# Patient Record
Sex: Female | Born: 1995 | Race: Black or African American | Hispanic: Yes | Marital: Single | Smoking: Never smoker
Health system: Southern US, Community
[De-identification: ages and names within clinical notes are randomized; demographics above are authoritative.]

## PROBLEM LIST (undated history)

## (undated) DIAGNOSIS — Z789 Other specified health status: Secondary | ICD-10-CM

## (undated) HISTORY — PX: TUBAL LIGATION: SHX77

---
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
## 2016-07-06 ENCOUNTER — Encounter: Payer: Self-pay | Admitting: Physician Assistant

## 2016-07-06 ENCOUNTER — Ambulatory Visit (INDEPENDENT_AMBULATORY_CARE_PROVIDER_SITE_OTHER): Payer: 59 | Admitting: Physician Assistant

## 2016-07-06 VITALS — BP 102/61 | HR 104 | Temp 98.3°F | Resp 16 | Ht 68.0 in | Wt 179.0 lb

## 2016-07-06 DIAGNOSIS — J020 Streptococcal pharyngitis: Secondary | ICD-10-CM

## 2016-07-06 LAB — POCT RAPID STREP A (OFFICE): Rapid Strep A Screen: POSITIVE — AB

## 2016-07-06 MED ORDER — AMOXICILLIN 500 MG PO CAPS: 500.0000 mg | ORAL_CAPSULE | Freq: Two times a day (BID) | ORAL | 0 refills | Status: DC

## 2016-07-06 MED FILL — AMOXICILLIN 500 MG CAPSULE: 500 | 10 days supply | Qty: 20 | Fill #0

## 2016-07-06 NOTE — Patient Instructions (Signed)
Please take antibiotic as directed with food. Stay well hydrated and get plenty of rest. Tylenol or Ibuprofen for fevers and throat pain. Salt water gargles will be beneficial.  Symptoms will gradually resolve with medication. Call or return to clinic if symptoms are not resolving.   Strep Throat Strep throat is an infection of the throat. It is caused by germs. Strep throat spreads from person to person because of coughing, sneezing, or close contact. HOME CARE Medicines  Take over-the-counter and prescription medicines only as told by your doctor.  Take your antibiotic medicine as told by your doctor. Do not stop taking the medicine even if you feel better.  Have family members who also have a sore throat or fever go to a doctor. Eating and Drinking  Do not share food, drinking cups, or personal items.  Try eating soft foods until your sore throat feels better.  Drink enough fluid to keep your pee (urine) clear or pale yellow. General Instructions  Rinse your mouth (gargle) with a salt-water mixture 3-4 times per day or as needed. To make a salt-water mixture, stir -1 tsp of salt into 1 cup of warm water.  Make sure that all people in your house wash their hands well.  Rest.  Stay home from school or work until you have been taking antibiotics for 24 hours.  Keep all follow-up visits as told by your doctor. This is important. GET HELP IF:  Your neck keeps getting bigger.  You get a rash, cough, or earache.  You cough up thick liquid that is green, yellow-brown, or bloody.  You have pain that does not get better with medicine.  Your problems get worse instead of getting better.  You have a fever. GET HELP RIGHT AWAY IF:  You throw up (vomit).  You get a very bad headache.  You neck hurts or it feels stiff.  You have chest pain or you are short of breath.  You have drooling, very bad throat pain, or changes in your voice.  Your neck is swollen or the  skin gets red and tender.  Your mouth is dry or you are peeing less than normal.  You keep feeling more tired or it is hard to wake up.  Your joints are red or they hurt.   This information is not intended to replace advice given to you by your health care provider. Make sure you discuss any questions you have with your health care provider.   Document Released: 03/12/2008 Document Revised: 06/15/2015 Document Reviewed: 01/17/2015 Elsevier Interactive Patient Education [employer]2016 Elsevier Inc.

## 2016-07-06 NOTE — Progress Notes (Signed)
    Patient presents to clinic today c/o 3 days of progressively worsening sore throat with odynophagia. Has noted mild PND without cough of chest congestion. Denies sinus pressure/pain or ear pressure. Is unsure of fever. Denies recent travel or sick contact. Has not taken anything for symptoms.   No past medical history on file.  No current outpatient prescriptions on file prior to visit.   No current facility-administered medications on file prior to visit.     No Known Allergies  Family History  Problem Relation Age of Onset  . Alcohol abuse Maternal Grandmother   . Arthritis Maternal Grandmother   . Hyperlipidemia Maternal Grandmother   . Heart disease Maternal Grandmother   . Hyperlipidemia Maternal Grandfather   . Heart disease Maternal Grandfather   . Diabetes Maternal Grandfather   . Hyperlipidemia Paternal Grandmother   . Heart disease Paternal Grandmother   . Hyperlipidemia Paternal Grandfather   . Heart disease Paternal Grandfather   . Diabetes Paternal Grandfather     Social History   Social History  . Marital status: Single    Spouse name: N/A  . Number of children: N/A  . Years of education: N/A   Social History Main Topics  . Smoking status: Never Smoker  . Smokeless tobacco: None  . Alcohol use No  . Drug use: No  . Sexual activity: No   Other Topics Concern  . None   Social History Narrative  . None   Review of Systems - See HPI.  All other ROS are negative.  BP 102/61 (BP Location: Left Arm, Patient Position: Sitting, Cuff Size: Large)   Pulse (!) 104   Temp 98.3 F (36.8 C) (Oral)   Resp 16   Ht 5\' 8"  (1.727 m)   Wt 179 lb (81.2 kg)   LMP 06/24/2016   SpO2 99%   BMI 27.22 kg/m   Physical Exam  Constitutional: She is oriented to person, place, and time and well-developed, well-nourished, and in no distress.  HENT:  Head: Normocephalic and atraumatic.  Right Ear: Tympanic membrane and external ear normal.  Left Ear: Tympanic  membrane and external ear normal.  Nose: Nose normal.  Mouth/Throat: Uvula is midline and mucous membranes are normal. Oropharyngeal exudate and posterior oropharyngeal erythema present.  Eyes: Conjunctivae are normal.  Neck: Neck supple.  Cardiovascular: Regular rhythm, normal heart sounds and intact distal pulses.   Mildly tachycardic. Pulse rechecked at 102 bpm  Pulmonary/Chest: Effort normal and breath sounds normal. No respiratory distress. She has no wheezes. She has no rales. She exhibits no tenderness.  Lymphadenopathy:    She has cervical adenopathy.  Neurological: She is alert and oriented to person, place, and time.  Skin: Skin is warm and dry. No rash noted.  Vitals reviewed.  Assessment/Plan: 1. Streptococcal sore throat Rapid strep +. Will start ABX - Amoxicillin as directed below. Supportive measures and OTC medications reviewed with patient. FU PRN if symptoms are not resolving.  - amoxicillin (AMOXIL) 500 MG capsule; Take 1 capsule (500 mg total) by mouth 2 (two) times daily.  Dispense: 20 capsule; Refill: 0   Piedad ClimesMartin, Drucilla Cumber [place], [place]-C

## 2016-07-06 NOTE — Progress Notes (Signed)
Pre visit review using our clinic review tool, if applicable. No additional management support is needed unless otherwise documented below in the visit note/SLS  

## 2017-12-23 DIAGNOSIS — L03116 Cellulitis of left lower limb: Secondary | ICD-10-CM | POA: Diagnosis not present

## 2017-12-26 ENCOUNTER — Other Ambulatory Visit: Payer: Self-pay

## 2017-12-26 ENCOUNTER — Encounter: Payer: Self-pay | Admitting: Family Medicine

## 2017-12-26 ENCOUNTER — Ambulatory Visit (INDEPENDENT_AMBULATORY_CARE_PROVIDER_SITE_OTHER): Payer: 59 | Admitting: Family Medicine

## 2017-12-26 VITALS — BP 110/78 | HR 88 | Temp 98.6°F | Resp 14 | Ht 68.0 in | Wt 194.0 lb

## 2017-12-26 DIAGNOSIS — L03116 Cellulitis of left lower limb: Secondary | ICD-10-CM

## 2017-12-26 MED ORDER — DOXYCYCLINE HYCLATE 100 MG PO TABS: 100.0000 mg | ORAL_TABLET | Freq: Two times a day (BID) | ORAL | 0 refills | Status: DC

## 2017-12-26 MED FILL — DOXYCYCLINE HYCLATE 100 MG: 100 | 10 days supply | Qty: 20 | Fill #0

## 2017-12-26 NOTE — Progress Notes (Signed)
   Subjective:    Patient ID: Anita Fernandez, female    DOB: [birth year], 22 y.o.   MRN: [id number]  HPI UC f/u- pt went to UC on 3/18 and dx'd w/ cellulitis of L lower leg.  Was started on Bactrim.  Since then, she reports the leg has become more swollen, more painful.  No fevers.   Review of Systems For ROS see HPI     Objective:   Physical Exam  Constitutional: She appears well-developed and well-nourished. No distress.  Neurological:  Antalgic gait bc of inability to bend L knee  Skin: Skin is warm and dry. There is erythema (small scabbed area in popliteal fossa of L knee w/ marked induration and surrounding redness up to mid thigh and extending around to anterior lower leg).  Vitals reviewed.         Assessment & Plan:  Cellulitis- new to provider, deteriorating per pt despite recent abx.  No fluctuant or drainable area.  Switch to Doxy.  Drew border around current margin of redness and instructed her that if the redness or swelling continues to worsen, she is to go to ER for IV abx.  Pt expressed understanding and is in agreement w/ plan.

## 2017-12-26 NOTE — Patient Instructions (Signed)
Follow up on Monday to recheck infection STOP the Bactrim START the Doxycycline twice daily- w/ food- 1st dose tonight If the redness rapidly races past my outline- please go to the ER! If the pain worsens, please let me know! Hot or cold compresses Call with any questions or concerns Hang in there!!!

## 2017-12-30 ENCOUNTER — Ambulatory Visit (INDEPENDENT_AMBULATORY_CARE_PROVIDER_SITE_OTHER): Payer: 59 | Admitting: Family Medicine

## 2017-12-30 ENCOUNTER — Other Ambulatory Visit: Payer: Self-pay

## 2017-12-30 ENCOUNTER — Encounter: Payer: Self-pay | Admitting: Family Medicine

## 2017-12-30 VITALS — BP 108/80 | HR 86 | Temp 98.7°F | Resp 17 | Ht 68.0 in | Wt 194.4 lb

## 2017-12-30 DIAGNOSIS — L02416 Cutaneous abscess of left lower limb: Secondary | ICD-10-CM | POA: Diagnosis not present

## 2017-12-30 DIAGNOSIS — L03116 Cellulitis of left lower limb: Secondary | ICD-10-CM

## 2017-12-30 NOTE — Patient Instructions (Signed)
Follow up by phone/MyChart in 1 week to let me know how your leg looks Continue the Doxy as directed- take w/ food Apply hot compresses to the leg to help it drain Keep it covered with a bandaid Call with any questions or concerns Hang in there!

## 2017-12-30 NOTE — Progress Notes (Signed)
   Subjective:    Patient ID: Anita Fernandez, female    DOB: [birth year], 22 y.o.   MRN: [id number]  HPI L leg cellulitis- pt started on Doxy on Thursday night.  Reports redness is better, swelling is better.  Scabbed came off and area has been bleeding and oozing regularly.  Pain is improving.   Review of Systems For ROS see HPI     Objective:   Physical Exam  Constitutional: She appears well-developed and well-nourished.  Skin: Skin is warm and dry. There is erythema (much less erythema and induration surrounding central wound in popliteal fossa.  Wound is much larger and oozing as compared to last week but no fluctuance or pus to express).  Vitals reviewed.         Assessment & Plan:  Cellulitis- improving w/ Doxy.  No med changes at this time.  Reviewed wound care.  Reviewed supportive care and red flags that should prompt return.  Pt expressed understanding and is in agreement w/ plan.

## 2018-01-02 ENCOUNTER — Ambulatory Visit: Payer: 59 | Admitting: Family Medicine

## 2018-01-08 ENCOUNTER — Ambulatory Visit: Payer: 59 | Admitting: Family Medicine

## 2018-02-10 DIAGNOSIS — Z3201 Encounter for pregnancy test, result positive: Secondary | ICD-10-CM | POA: Diagnosis not present

## 2018-02-27 DIAGNOSIS — Z3401 Encounter for supervision of normal first pregnancy, first trimester: Secondary | ICD-10-CM | POA: Diagnosis not present

## 2018-03-13 DIAGNOSIS — Z3401 Encounter for supervision of normal first pregnancy, first trimester: Secondary | ICD-10-CM | POA: Diagnosis not present

## 2018-03-13 DIAGNOSIS — R87613 High grade squamous intraepithelial lesion on cytologic smear of cervix (HGSIL): Secondary | ICD-10-CM | POA: Diagnosis not present

## 2018-03-27 DIAGNOSIS — Z3682 Encounter for antenatal screening for nuchal translucency: Secondary | ICD-10-CM | POA: Diagnosis not present

## 2018-03-27 DIAGNOSIS — Z36 Encounter for antenatal screening for chromosomal anomalies: Secondary | ICD-10-CM | POA: Diagnosis not present

## 2018-05-01 DIAGNOSIS — R87611 Atypical squamous cells cannot exclude high grade squamous intraepithelial lesion on cytologic smear of cervix (ASC-H): Secondary | ICD-10-CM | POA: Diagnosis not present

## 2018-05-01 DIAGNOSIS — Z3A17 17 weeks gestation of pregnancy: Secondary | ICD-10-CM | POA: Diagnosis not present

## 2018-05-01 DIAGNOSIS — Z363 Encounter for antenatal screening for malformations: Secondary | ICD-10-CM | POA: Diagnosis not present

## 2018-05-15 DIAGNOSIS — Z363 Encounter for antenatal screening for malformations: Secondary | ICD-10-CM | POA: Diagnosis not present

## 2018-07-10 DIAGNOSIS — Z23 Encounter for immunization: Secondary | ICD-10-CM | POA: Diagnosis not present

## 2018-07-10 DIAGNOSIS — Z3402 Encounter for supervision of normal first pregnancy, second trimester: Secondary | ICD-10-CM | POA: Diagnosis not present

## 2018-07-10 DIAGNOSIS — O99012 Anemia complicating pregnancy, second trimester: Secondary | ICD-10-CM | POA: Diagnosis not present

## 2018-08-07 DIAGNOSIS — R87611 Atypical squamous cells cannot exclude high grade squamous intraepithelial lesion on cytologic smear of cervix (ASC-H): Secondary | ICD-10-CM | POA: Diagnosis not present

## 2018-08-07 DIAGNOSIS — Z3A31 31 weeks gestation of pregnancy: Secondary | ICD-10-CM | POA: Diagnosis not present

## 2018-09-16 ENCOUNTER — Other Ambulatory Visit: Payer: Self-pay | Admitting: General Practice

## 2018-09-16 DIAGNOSIS — Z3A37 37 weeks gestation of pregnancy: Secondary | ICD-10-CM | POA: Diagnosis not present

## 2018-09-16 DIAGNOSIS — R030 Elevated blood-pressure reading, without diagnosis of hypertension: Secondary | ICD-10-CM | POA: Diagnosis not present

## 2018-09-16 DIAGNOSIS — M7989 Other specified soft tissue disorders: Secondary | ICD-10-CM | POA: Diagnosis not present

## 2018-09-16 DIAGNOSIS — O1403 Mild to moderate pre-eclampsia, third trimester: Secondary | ICD-10-CM | POA: Diagnosis not present

## 2018-09-16 DIAGNOSIS — R600 Localized edema: Secondary | ICD-10-CM | POA: Diagnosis not present

## 2018-09-16 DIAGNOSIS — O329XX1 Maternal care for malpresentation of fetus, unspecified, fetus 1: Secondary | ICD-10-CM | POA: Diagnosis not present

## 2018-09-16 DIAGNOSIS — O26893 Other specified pregnancy related conditions, third trimester: Secondary | ICD-10-CM | POA: Diagnosis not present

## 2018-09-16 DIAGNOSIS — Z3483 Encounter for supervision of other normal pregnancy, third trimester: Secondary | ICD-10-CM | POA: Diagnosis not present

## 2018-09-16 DIAGNOSIS — Z3685 Encounter for antenatal screening for Streptococcus B: Secondary | ICD-10-CM | POA: Diagnosis not present

## 2018-09-16 LAB — HM PAP SMEAR

## 2018-09-21 DIAGNOSIS — Z3A37 37 weeks gestation of pregnancy: Secondary | ICD-10-CM | POA: Diagnosis not present

## 2018-09-21 DIAGNOSIS — Z79899 Other long term (current) drug therapy: Secondary | ICD-10-CM | POA: Diagnosis not present

## 2018-09-21 DIAGNOSIS — O99214 Obesity complicating childbirth: Secondary | ICD-10-CM | POA: Diagnosis not present

## 2018-09-21 DIAGNOSIS — E669 Obesity, unspecified: Secondary | ICD-10-CM | POA: Diagnosis not present

## 2018-09-21 DIAGNOSIS — Z8759 Personal history of other complications of pregnancy, childbirth and the puerperium: Secondary | ICD-10-CM | POA: Diagnosis not present

## 2018-09-21 DIAGNOSIS — O471 False labor at or after 37 completed weeks of gestation: Secondary | ICD-10-CM | POA: Diagnosis not present

## 2018-09-21 DIAGNOSIS — O99824 Streptococcus B carrier state complicating childbirth: Secondary | ICD-10-CM | POA: Diagnosis not present

## 2018-10-30 DIAGNOSIS — Z130 Encounter for screening for diseases of the blood and blood-forming organs and certain disorders involving the immune mechanism: Secondary | ICD-10-CM | POA: Diagnosis not present

## 2018-10-30 DIAGNOSIS — R87613 High grade squamous intraepithelial lesion on cytologic smear of cervix (HGSIL): Secondary | ICD-10-CM | POA: Diagnosis not present

## 2018-10-30 DIAGNOSIS — R87610 Atypical squamous cells of undetermined significance on cytologic smear of cervix (ASC-US): Secondary | ICD-10-CM | POA: Diagnosis not present

## 2018-10-30 LAB — HM PAP SMEAR

## 2018-10-31 DIAGNOSIS — R87613 High grade squamous intraepithelial lesion on cytologic smear of cervix (HGSIL): Secondary | ICD-10-CM | POA: Diagnosis not present

## 2018-10-31 DIAGNOSIS — N72 Inflammatory disease of cervix uteri: Secondary | ICD-10-CM | POA: Diagnosis not present

## 2018-11-06 ENCOUNTER — Encounter: Payer: Self-pay | Admitting: General Practice

## 2018-11-19 ENCOUNTER — Encounter: Payer: Self-pay | Admitting: Family Medicine

## 2018-11-19 ENCOUNTER — Other Ambulatory Visit: Payer: Self-pay

## 2018-11-19 ENCOUNTER — Ambulatory Visit (INDEPENDENT_AMBULATORY_CARE_PROVIDER_SITE_OTHER): Payer: 59 | Admitting: Family Medicine

## 2018-11-19 VITALS — BP 118/76 | HR 105 | Temp 99.1°F | Resp 16 | Ht 68.0 in | Wt 198.4 lb

## 2018-11-19 DIAGNOSIS — J029 Acute pharyngitis, unspecified: Secondary | ICD-10-CM

## 2018-11-19 LAB — POCT RAPID STREP A (OFFICE): Rapid Strep A Screen: NEGATIVE

## 2018-11-19 NOTE — Progress Notes (Signed)
   Subjective  CC:  Chief Complaint  Patient presents with  . Sore Throat    Started yesterday, has tried OTC medicine.    HPI: Anita Fernandez is a 23 y.o. female who presents to the office today to address the problems listed above in the chief complaint.  C/o sore throat, mild URI sxs without fever. No SOB or GI sxs. No known exposure ot strep or mono. OTC analgesics have been used with minimal or mild relief. Eating and drinking OK.   I reviewed the patients updated PMH, FH, and SocHx.    Patient Active Problem List   Diagnosis Date Noted  . Insomnia 11/29/2015  . Fatigue 11/29/2015  . Vaginitis and vulvovaginitis 12/15/2014  . Lumbar back pain 06/23/2012  . Sinus headache 06/23/2012   No outpatient medications have been marked as taking for the 11/19/18 encounter (Office Visit) with Willow Ora, MD.    Allergies: Patient has No Known Allergies.  Review of Systems: Constitutional: Negative for fever malaise or anorexia Cardiovascular: negative for chest pain Respiratory: negative for SOB or persistent cough Gastrointestinal: negative for abdominal pain  Objective  Vitals: BP 118/76   Pulse (!) 105   Temp 99.1 F (37.3 C) (Oral)   Resp 16   Ht 5\' 8"  (1.727 m)   Wt 198 lb 6.4 oz (90 kg)   SpO2 98%   BMI 30.17 kg/m  General: no acute distress , A&Ox3 HEENT: PEERL, conjunctiva normal, bilateral EAC and TMs are normal. Nares normal. Oropharynx moist with erythematous posterior pharynx without exudate, + cervical LAD, 2+ tonsils, midline uvula, neck is supple Cardiovascular:  RRR without murmur or gallop.  Respiratory:  Good breath sounds bilaterally, CTAB with normal respiratory effort Skin:  Warm, no rashes  Office Visit on 11/19/2018  Component Date Value Ref Range Status  . Rapid Strep A Screen 11/19/2018 Negative  Negative Final    Assessment  1. Viral pharyngitis   2. Sore throat      Plan   Supportive care with advil, tylenol, gargles etc  discussed. . RTO if sxs persist or worsen.   Follow up: Return if symptoms worsen or fail to improve.    Commons side effects, risks, benefits, and alternatives for medications and treatment plan prescribed today were discussed, and the patient expressed understanding of the given instructions. Patient is instructed to call or message via MyChart if he/she has any questions or concerns regarding our treatment plan. No barriers to understanding were identified. We discussed Red Flag symptoms and signs in detail. Patient expressed understanding regarding what to do in case of urgent or emergency type symptoms.   Medication list was reconciled, printed and provided to the patient in AVS. Patient instructions and summary information was reviewed with the patient as documented in the AVS. This note was prepared with assistance of Dragon voice recognition software. Occasional wrong-word or sound-a-like substitutions may have occurred due to the inherent limitations of voice recognition software  Orders Placed This Encounter  Procedures  . POCT rapid strep A   No orders of the defined types were placed in this encounter.

## 2018-11-19 NOTE — Patient Instructions (Signed)
Please follow up if symptoms do not improve or as needed.    Pharyngitis  Pharyngitis is redness, pain, and swelling (inflammation) of the throat (pharynx). It is a very common cause of sore throat. Pharyngitis can be caused by a bacteria, but it is usually caused by a virus. Most cases of pharyngitis get better on their own without treatment. What are the causes? This condition may be caused by:  Infection by viruses (viral). Viral pharyngitis spreads from person to person (is contagious) through coughing, sneezing, and sharing of personal items or utensils such as cups, forks, spoons, and toothbrushes.  Infection by bacteria (bacterial). Bacterial pharyngitis may be spread by touching the nose or face after coming in contact with the bacteria, or through more intimate contact, such as kissing.  Allergies. Allergies can cause buildup of mucus in the throat (post-nasal drip), leading to inflammation and irritation. Allergies can also cause blocked nasal passages, forcing breathing through the mouth, which dries and irritates the throat. What increases the risk? You are more likely to develop this condition if:  You are 5-24 years old.  You are exposed to crowded environments such as daycare, school, or dormitory living.  You live in a cold climate.  You have a weakened disease-fighting (immune) system. What are the signs or symptoms? Symptoms of this condition vary by the cause (viral, bacterial, or allergies) and can include:  Sore throat.  Fatigue.  Low-grade fever.  Headache.  Joint pain and muscle aches.  Skin rashes.  Swollen glands in the throat (lymph nodes).  Plaque-like film on the throat or tonsils. This is often a symptom of bacterial pharyngitis.  Vomiting.  Stuffy nose (nasal congestion).  Cough.  Red, itchy eyes (conjunctivitis).  Loss of appetite. How is this diagnosed? This condition is often diagnosed based on your medical history and a physical  exam. Your health care provider will ask you questions about your illness and your symptoms. A swab of your throat may be done to check for bacteria (rapid strep test). Other lab tests may also be done, depending on the suspected cause, but these are rare. How is this treated? This condition usually gets better in 3-4 days without medicine. Bacterial pharyngitis may be treated with antibiotic medicines. Follow these instructions at home:  Take over-the-counter and prescription medicines only as told by your health care provider. ? If you were prescribed an antibiotic medicine, take it as told by your health care provider. Do not stop taking the antibiotic even if you start to feel better. ? Do not give children aspirin because of the association with Reye syndrome.  Drink enough water and fluids to keep your urine clear or pale yellow.  Get a lot of rest.  Gargle with a salt-water mixture 3-4 times a day or as needed. To make a salt-water mixture, completely dissolve -1 tsp of salt in 1 cup of warm water.  If your health care provider approves, you may use throat lozenges or sprays to soothe your throat. Contact a health care provider if:  You have large, tender lumps in your neck.  You have a rash.  You cough up green, yellow-brown, or bloody spit. Get help right away if:  Your neck becomes stiff.  You drool or are unable to swallow liquids.  You cannot drink or take medicines without vomiting.  You have severe pain that does not go away, even after you take medicine.  You have trouble breathing, and it is not caused by a   stuffy nose.  You have new pain and swelling in your joints such as the knees, ankles, wrists, or elbows. Summary  Pharyngitis is redness, pain, and swelling (inflammation) of the throat (pharynx).  While pharyngitis can be caused by a bacteria, the most common causes are viral.  Most cases of pharyngitis get better on their own without  treatment.  Bacterial pharyngitis is treated with antibiotic medicines. This information is not intended to replace advice given to you by your health care provider. Make sure you discuss any questions you have with your health care provider. Document Released: 09/24/2005 Document Revised: 10/30/2016 Document Reviewed: 10/30/2016 Elsevier Interactive Patient Education  2019 Elsevier Inc.   

## 2018-11-20 ENCOUNTER — Encounter: Payer: Self-pay | Admitting: Family Medicine

## 2019-02-05 ENCOUNTER — Ambulatory Visit (INDEPENDENT_AMBULATORY_CARE_PROVIDER_SITE_OTHER): Payer: 59 | Admitting: Family Medicine

## 2019-02-05 ENCOUNTER — Encounter: Payer: Self-pay | Admitting: Family Medicine

## 2019-02-05 ENCOUNTER — Other Ambulatory Visit: Payer: Self-pay

## 2019-02-05 VITALS — Ht 68.0 in | Wt 194.0 lb

## 2019-02-05 DIAGNOSIS — R5383 Other fatigue: Secondary | ICD-10-CM

## 2019-02-05 DIAGNOSIS — L659 Nonscarring hair loss, unspecified: Secondary | ICD-10-CM | POA: Diagnosis not present

## 2019-02-05 NOTE — Progress Notes (Signed)
   Virtual Visit via Video   I connected with patient on 02/05/19 at  1:00 PM EDT by a video enabled telemedicine application and verified that I am speaking with the correct person using two identifiers.  Location patient: Home Location provider: [facility], Office Persons participating in the virtual visit: Patient, Provider, CMA (Jess B)  I discussed the limitations of evaluation and management by telemedicine and the availability of in person appointments. The patient expressed understanding and agreed to proceed.  Subjective:   HPI:  Hair loss- pt reports recent hair loss (~2 weeks).  She is 4 months post-partum.  She talked to [facility] and was told to contact primary.  + fatigue.  Heavier menstrual cycle.  Pt does have hx of low iron.     ROS:   See pertinent positives and negatives per HPI.  Patient Active Problem List   Diagnosis Date Noted  . Insomnia 11/29/2015  . Fatigue 11/29/2015  . Vaginitis and vulvovaginitis 12/15/2014  . Lumbar back pain 06/23/2012  . Sinus headache 06/23/2012    Social History   Tobacco Use  . Smoking status: Never Smoker  . Smokeless tobacco: Never Used  Substance Use Topics  . Alcohol use: No   No current outpatient medications on file.  No Known Allergies  Objective:   Ht 5\' 8"  (1.727 m)   Wt 194 lb (88 kg)   BMI 29.50 kg/m   AAOx3, NAD NCAT, EOMI No obvious CN deficits Coloring WNL Pt is able to speak clearly, coherently without shortness of breath or increased work of breathing.  Thought process is linear.  Mood is appropriate.   Assessment and Plan:   Hair loss/fatigue- discussed that this could be thyroid related or iron deficiency but that most likely it is just post-partum.  We will check labs to ensure that thyroid and iron levels are normal.  Pt expressed understanding and is in agreement w/ plan.     Neena Rhymes, MD 02/05/2019

## 2019-02-05 NOTE — Progress Notes (Signed)
I have discussed the procedure for the virtual visit with the patient who has given consent to proceed with assessment and treatment.   Tamecca Artiga L Renan Danese, CMA     

## 2019-02-06 ENCOUNTER — Other Ambulatory Visit (INDEPENDENT_AMBULATORY_CARE_PROVIDER_SITE_OTHER): Payer: 59

## 2019-02-06 DIAGNOSIS — R5383 Other fatigue: Secondary | ICD-10-CM | POA: Diagnosis not present

## 2019-02-06 DIAGNOSIS — L659 Nonscarring hair loss, unspecified: Secondary | ICD-10-CM | POA: Diagnosis not present

## 2019-02-06 LAB — CBC WITH DIFFERENTIAL/PLATELET
Basophils Absolute: 0 10*3/uL (ref 0.0–0.1)
Basophils Relative: 0.2 % (ref 0.0–3.0)
Eosinophils Absolute: 0.1 10*3/uL (ref 0.0–0.7)
Eosinophils Relative: 1.1 % (ref 0.0–5.0)
HCT: 34.8 % — ABNORMAL LOW (ref 36.0–46.0)
Hemoglobin: 11.8 g/dL — ABNORMAL LOW (ref 12.0–15.0)
Lymphocytes Relative: 29 % (ref 12.0–46.0)
Lymphs Abs: 1.6 10*3/uL (ref 0.7–4.0)
MCHC: 33.9 g/dL (ref 30.0–36.0)
MCV: 77.1 fl — ABNORMAL LOW (ref 78.0–100.0)
Monocytes Absolute: 0.4 10*3/uL (ref 0.1–1.0)
Monocytes Relative: 7.5 % (ref 3.0–12.0)
Neutro Abs: 3.4 10*3/uL (ref 1.4–7.7)
Neutrophils Relative %: 62.2 % (ref 43.0–77.0)
Platelets: 258 10*3/uL (ref 150.0–400.0)
RBC: 4.51 Mil/uL (ref 3.87–5.11)
RDW: 15.4 % (ref 11.5–15.5)
WBC: 5.5 10*3/uL (ref 4.0–10.5)

## 2019-02-06 LAB — TSH: TSH: 1 u[IU]/mL (ref 0.35–4.50)

## 2019-02-07 LAB — IRON,TIBC AND FERRITIN PANEL
%SAT: 8 % (calc) — ABNORMAL LOW (ref 16–45)
Ferritin: 6 ng/mL — ABNORMAL LOW (ref 16–154)
Iron: 22 ug/dL — ABNORMAL LOW (ref 40–190)
TIBC: 269 mcg/dL (calc) (ref 250–450)

## 2019-02-09 ENCOUNTER — Encounter: Payer: Self-pay | Admitting: Family Medicine

## 2019-02-09 ENCOUNTER — Other Ambulatory Visit: Payer: Self-pay | Admitting: General Practice

## 2019-02-09 MED ORDER — FERROUS SULFATE 325 (65 FE) MG PO TABS: 325.0000 mg | ORAL_TABLET | Freq: Every day | ORAL | 1 refills | Status: DC

## 2019-10-12 ENCOUNTER — Telehealth: Payer: Self-pay | Admitting: Family Medicine

## 2019-10-12 ENCOUNTER — Encounter: Payer: Self-pay | Admitting: General Practice

## 2019-10-12 NOTE — Telephone Encounter (Signed)
My chart message also sent.

## 2019-10-12 NOTE — Telephone Encounter (Signed)
Please advise on refill and I have LM for pt to call back to schedule a VV with Tabori.     Copied from CRM [id number]. Topic: General - Other >> Oct 08, 2019  1:57 PM Jaquita Rector A wrote: Reason for CRM: Patient called to request an Rx sent to her pharmacy from Dr Beverely Low. Patient has SOB she is willing to do a virtual visit and states that if there are any questions please call her at Ph# [phone] >> Oct 08, 2019  2:04 PM Jaquita Rector A wrote: [id number] - [place], [place] - [address] Phone:  [phone]  Fax:  [phone]

## 2019-10-13 NOTE — Telephone Encounter (Signed)
Tried calling pt again this morning. Encounter closed until pt returns call.

## 2019-11-12 DIAGNOSIS — R87610 Atypical squamous cells of undetermined significance on cytologic smear of cervix (ASC-US): Secondary | ICD-10-CM | POA: Diagnosis not present

## 2019-11-27 MED FILL — NORLYDA 0.35 MG TABS: 0.35 | 28 days supply | Qty: 28 | Fill #0

## 2019-12-08 MED FILL — NORLYDA 0.35 MG TABS: 0.35 | 28 days supply | Qty: 28 | Fill #0

## 2019-12-30 MED FILL — NORETHINDRONE 0.35 MG TABS: 0.35 | 84 days supply | Qty: 84 | Fill #0

## 2020-02-09 DIAGNOSIS — N72 Inflammatory disease of cervix uteri: Secondary | ICD-10-CM | POA: Diagnosis not present

## 2020-02-09 DIAGNOSIS — R87810 Cervical high risk human papillomavirus (HPV) DNA test positive: Secondary | ICD-10-CM | POA: Diagnosis not present

## 2020-02-09 DIAGNOSIS — B977 Papillomavirus as the cause of diseases classified elsewhere: Secondary | ICD-10-CM | POA: Diagnosis not present

## 2020-03-28 MED FILL — NORETHINDRONE 0.35 MG TABS: 0.35 | 84 days supply | Qty: 84 | Fill #1

## 2020-05-07 ENCOUNTER — Ambulatory Visit: Payer: 59

## 2020-06-06 ENCOUNTER — Other Ambulatory Visit: Payer: Self-pay

## 2020-06-06 ENCOUNTER — Encounter: Payer: Self-pay | Admitting: Physician Assistant

## 2020-06-06 ENCOUNTER — Telehealth (INDEPENDENT_AMBULATORY_CARE_PROVIDER_SITE_OTHER): Payer: 59 | Admitting: Physician Assistant

## 2020-06-06 DIAGNOSIS — Z20822 Contact with and (suspected) exposure to covid-19: Secondary | ICD-10-CM | POA: Diagnosis not present

## 2020-06-06 NOTE — Progress Notes (Signed)
° °  Virtual Visit via Video   I connected with patient on 06/06/20 at  3:00 PM EDT by a video enabled telemedicine application and verified that I am speaking with the correct person using two identifiers.  Location patient: Home Location provider: [place] [facility], Office Persons participating in the virtual visit: Patient, Provider, CMA (Patina Moore)  I discussed the limitations of evaluation and management by telemedicine and the availability of in person appointments. The patient expressed understanding and agreed to proceed.  Subjective:   HPI:   Patient presents via [facility] today c/o 1 day of chills, fatigue, headaches, body aches. Has felt feverish but has not been able to check temperature. Denies recent travel or sick contact.  Does note significant chest congestion and cough that is productive of yellow phlegm.  Denies loss of taste or smell.  Denies GI upset.  Has noted urine is slightly darker yellow than normal but denies any urinary urgency, frequency, hesitancy, abdominal pain, nausea or vomiting.  Denies flank pain.  Has not taken anything for her symptoms.  ROS:   See pertinent positives and negatives per HPI.  Patient Active Problem List   Diagnosis Date Noted   Insomnia 11/29/2015   Fatigue 11/29/2015   Vaginitis and vulvovaginitis 12/15/2014   Lumbar back pain 06/23/2012   Sinus headache 06/23/2012    Social History   Tobacco Use   Smoking status: Never Smoker   Smokeless tobacco: Never Used  Substance Use Topics   Alcohol use: No    Current Outpatient Medications:    norethindrone (MICRONOR) 0.35 MG tablet, Take 1 tablet by mouth daily., Disp: , Rfl:   No Known Allergies  Objective:   There were no vitals taken for this visit.  Patient is well-developed, well-nourished in no acute distress.  Resting comfortably at home.  Head is normocephalic, atraumatic.  No labored breathing.  Speech is clear and coherent with logical content.    Patient is alert and oriented at baseline.   Assessment and Plan:   1. Suspected COVID-19 virus infection Patient has been sent for testing.  She is to quarantine until test results are in.  Patient is also been enrolled in home Covid symptom monitoring program.  She is to complete daily questionnaires as directed.  Want her to continue hydrating well and getting plenty of rest.  Start saline nasal rinse.  OTC Mucinex DM recommended.  Patient also encouraged to start a regimen of vitamin D3 1000 units daily, vitamin C 1000 mg daily and an over-the-counter zinc supplement.  Other OTC medications reviewed with patient.  Giving change in coloration of her phlegm and slight cough noted last week (her son had RSV), will add on Azithromycin to cover for any bacterial bronchitis present.  Strict ER precautions reviewed with patient. - MyChart COVID-19 home monitoring program; Future - Temperature monitoring; Future    Piedad Climes, PA-C 06/06/2020

## 2020-06-06 NOTE — Patient Instructions (Signed)
Instructions sent to MyChart

## 2020-06-06 NOTE — Progress Notes (Signed)
I have discussed the procedure for the virtual visit with the patient who has given consent to proceed with assessment and treatment.   Ladaija Dimino S Lyon Dumont, CMA     

## 2020-06-07 MED ORDER — AZITHROMYCIN 250 MG PO TABS: ORAL_TABLET | ORAL | 0 refills | Status: DC

## 2020-06-09 ENCOUNTER — Other Ambulatory Visit: Payer: Self-pay

## 2020-06-09 ENCOUNTER — Emergency Department
Admission: EM | Admit: 2020-06-09 | Discharge: 2020-06-09 | Disposition: A | Discharge status: Home / Self Care | Payer: 59 | Source: Home / Self Care

## 2020-06-09 ENCOUNTER — Encounter: Payer: Self-pay | Admitting: Physician Assistant

## 2020-06-09 DIAGNOSIS — B085 Enteroviral vesicular pharyngitis: Secondary | ICD-10-CM

## 2020-06-09 DIAGNOSIS — K120 Recurrent oral aphthae: Secondary | ICD-10-CM

## 2020-06-09 DIAGNOSIS — J029 Acute pharyngitis, unspecified: Secondary | ICD-10-CM

## 2020-06-09 MED ORDER — MAGIC MOUTHWASH W/LIDOCAINE: 10.0000 mL | Freq: Three times a day (TID) | ORAL | 0 refills | Status: DC | PRN

## 2020-06-09 MED ORDER — ACETAMINOPHEN 325 MG PO TABS
650.0000 mg | ORAL_TABLET | Freq: Four times a day (QID) | ORAL | Status: DC | PRN
  Administered 2020-06-09: 650 mg via ORAL

## 2020-06-09 MED ORDER — PREDNISONE 50 MG PO TABS: 50.0000 mg | ORAL_TABLET | Freq: Every day | ORAL | 0 refills | Status: AC

## 2020-06-09 NOTE — Discharge Instructions (Addendum)
  You may take 500mg  acetaminophen every 4-6 hours or in combination with ibuprofen 400-600mg  every 6-8 hours as needed for pain, inflammation, and fever.  Be sure to well hydrated with clear liquids and get at least 8 hours of sleep at night, preferably more while sick.   Please follow up with family medicine in 4-5 days if not improving, sooner if worsening.

## 2020-06-09 NOTE — ED Triage Notes (Signed)
Patient presents to Urgent Care with complaints of tongue pain since 4 days ago. Patient reports she did a virtual visit and was told to be covid tested (she did and it was negative). Pt states she still has irritated taste buds on the lateral parts of her tongue and feels like she is talking with a lisp.

## 2020-06-09 NOTE — ED Provider Notes (Signed)
[facility]    CSN: [id number] Arrival date & time: 06/09/20  1235      History   Chief Complaint Chief Complaint  Patient presents with  . Oral Pain    HPI Anita Fernandez is a 24 y.o. female.   HPI Anita Fernandez is a 24 y.o. female presenting to UC with c/o sore throat, tongue pain, fever, chills, and body aches for about 4 days. She completed an Evisit and was instructed to be tested for COVID. She went toe a [facility] testing site for work, tested Negative.  She has been vaccinated for COVID with the J&J vaccine beginning of August. She had 2 days of body aches that fully resolved.  She feels like her taste buds are irritated on the sides of her tongue, making her feel like she is talking with a lisp. Denies n/v/d. No chest pain or SOB. No known sick contacts. She has been able to keep down fluids. She has been taking Tylenol but none PTA. She is also on day 4 of Azithromycin, prescribed during the E-visit.    History reviewed. No pertinent past medical history.  Patient Active Problem List   Diagnosis Date Noted  . Insomnia 11/29/2015  . Fatigue 11/29/2015  . Vaginitis and vulvovaginitis 12/15/2014  . Lumbar back pain 06/23/2012  . Sinus headache 06/23/2012    History reviewed. No pertinent surgical history.  OB History   No obstetric history on file.      Home Medications    Prior to Admission medications   Medication Sig Start Date End Date Taking? Authorizing Provider  azithromycin (ZITHROMAX) 250 MG tablet Take 2 tablets on Day 1. Then take 1 tablet daily. 06/07/20  Yes Waldon Merl, PA-C  magic mouthwash w/lidocaine SOLN Take 10 mLs by mouth 3 (three) times daily as needed for mouth pain. Swish, gargle for 30-60 seconds then spit 06/09/20   Lurene Shadow, PA-C  norethindrone (MICRONOR) 0.35 MG tablet Take 1 tablet by mouth daily. 03/28/20   [provider]  predniSONE (DELTASONE) 50 MG tablet Take 1 tablet (50 mg total) by mouth daily  with breakfast for 5 days. 06/09/20 06/14/20  Lurene Shadow, PA-C    Family History Family History  Problem Relation Age of Onset  . Alcohol abuse Maternal Grandmother   . Arthritis Maternal Grandmother   . Hyperlipidemia Maternal Grandmother   . Heart disease Maternal Grandmother   . Hyperlipidemia Maternal Grandfather   . Heart disease Maternal Grandfather   . Diabetes Maternal Grandfather   . Hyperlipidemia Paternal Grandmother   . Heart disease Paternal Grandmother   . Hyperlipidemia Paternal Grandfather   . Heart disease Paternal Grandfather   . Diabetes Paternal Grandfather   . Healthy Mother   . Healthy Father     Social History Social History   Tobacco Use  . Smoking status: Never Smoker  . Smokeless tobacco: Never Used  Vaping Use  . Vaping Use: Never used  Substance Use Topics  . Alcohol use: Yes    Comment: socially  . Drug use: No     Allergies   Patient has no known allergies.   Review of Systems Review of Systems  Constitutional: Positive for chills and fever.  HENT: Positive for mouth sores and sore throat. Negative for congestion, ear pain, trouble swallowing and voice change.   Respiratory: Negative for cough and shortness of breath.   Cardiovascular: Negative for chest pain and palpitations.  Gastrointestinal: Negative for abdominal  pain, diarrhea, nausea and vomiting.  Musculoskeletal: Negative for arthralgias, back pain and myalgias.  Skin: Negative for rash.  All other systems reviewed and are negative.    Physical Exam Triage Vital Signs ED Triage Vitals  Enc Vitals Group     BP 06/09/20 1304 114/76     Pulse Rate 06/09/20 1304 (!) 103     Resp 06/09/20 1304 16     Temp 06/09/20 1304 (!) 101.3 F (38.5 C)     Temp Source 06/09/20 1304 Oral     SpO2 06/09/20 1304 97 %     Weight --      Height --      Head Circumference --      Peak Flow --      Pain Score 06/09/20 1303 3     Pain Loc --      Pain Edu? --      Excl. in GC? --      No data found.  Updated Vital Signs BP 114/76 (BP Location: Right Arm)   Pulse (!) 103   Temp (!) 101.3 F (38.5 C) (Oral)   Resp 16   SpO2 97%   Visual Acuity Right Eye Distance:   Left Eye Distance:   Bilateral Distance:    Right Eye Near:   Left Eye Near:    Bilateral Near:     Physical Exam Vitals and nursing note reviewed.  Constitutional:      General: She is not in acute distress.    Appearance: Normal appearance. She is well-developed. She is not ill-appearing, toxic-appearing or diaphoretic.  HENT:     Head: Normocephalic and atraumatic.     Right Ear: Tympanic membrane and ear canal normal.     Left Ear: Tympanic membrane and ear canal normal.     Nose: Nose normal.     Mouth/Throat:     Lips: Pink.     Mouth: Mucous membranes are moist.     Tongue: Lesions present.     Pharynx: Oropharynx is clear. Uvula midline. Posterior oropharyngeal erythema present. No pharyngeal swelling, oropharyngeal exudate or uvula swelling.     Tonsils: No tonsillar exudate or tonsillar abscesses.   Cardiovascular:     Rate and Rhythm: Normal rate and regular rhythm.  Pulmonary:     Effort: Pulmonary effort is normal. No respiratory distress.     Breath sounds: Normal breath sounds. No stridor. No wheezing, rhonchi or rales.  Musculoskeletal:        General: Normal range of motion.     Cervical back: Normal range of motion.  Skin:    General: Skin is warm and dry.  Neurological:     Mental Status: She is alert and oriented to person, place, and time.  Psychiatric:        Behavior: Behavior normal.      UC Treatments / Results  Labs (all labs ordered are listed, but only abnormal results are displayed) Labs Reviewed - No data to display  EKG   Radiology No results found.  Procedures Procedures (including critical care time)  Medications Ordered in UC Medications  acetaminophen (TYLENOL) tablet 650 mg (650 mg Oral Given 06/09/20 1309)    Initial Impression  / Assessment and Plan / UC Course  I have reviewed the triage vital signs and the nursing notes.  Pertinent labs & imaging results that were available during my care of the patient were reviewed by me and considered in my medical decision making (see chart  for details).     Hx and exam c/w viral pharyngitis Pt is on day 4 of azithromycin, may complete the 5 day course Will tx symptomatically Encouraged to stay out of work until fever free for 24 hours even though COVID was negative  F/u with PCP as needed AVS given  Final Clinical Impressions(s) / UC Diagnoses   Final diagnoses:  Acute herpangina  Acute pharyngitis, unspecified etiology  Canker sore     Discharge Instructions      You may take 500mg  acetaminophen every 4-6 hours or in combination with ibuprofen 400-600mg  every 6-8 hours as needed for pain, inflammation, and fever.  Be sure to well hydrated with clear liquids and get at least 8 hours of sleep at night, preferably more while sick.   Please follow up with family medicine in 4-5 days if not improving, sooner if worsening.     ED Prescriptions    Medication Sig Dispense Auth. Provider   magic mouthwash w/lidocaine SOLN Take 10 mLs by mouth 3 (three) times daily as needed for mouth pain. Swish, gargle for 30-60 seconds then spit 100 mL Izetta Sakamoto O, PA-C   predniSONE (DELTASONE) 50 MG tablet Take 1 tablet (50 mg total) by mouth daily with breakfast for 5 days. 5 tablet 05-20-1987, PA-C     PDMP not reviewed this encounter.   Lurene Shadow, Lurene Shadow 06/09/20 1443

## 2020-06-14 ENCOUNTER — Encounter: Payer: Self-pay | Admitting: Family Medicine

## 2020-06-15 MED FILL — NORETHINDRONE 0.35 MG TABS: 0.35 | 84 days supply | Qty: 84 | Fill #2

## 2020-09-06 ENCOUNTER — Other Ambulatory Visit (HOSPITAL_COMMUNITY): Payer: Self-pay

## 2020-09-06 MED FILL — NORETHINDRONE 0.35 MG TABS: 0.35 | 84 days supply | Qty: 84 | Fill #0

## 2020-11-29 ENCOUNTER — Other Ambulatory Visit (HOSPITAL_COMMUNITY): Payer: Self-pay

## 2020-11-30 DIAGNOSIS — Z01419 Encounter for gynecological examination (general) (routine) without abnormal findings: Secondary | ICD-10-CM | POA: Diagnosis not present

## 2020-11-30 DIAGNOSIS — Z118 Encounter for screening for other infectious and parasitic diseases: Secondary | ICD-10-CM | POA: Diagnosis not present

## 2020-11-30 DIAGNOSIS — Z6835 Body mass index (BMI) 35.0-35.9, adult: Secondary | ICD-10-CM | POA: Diagnosis not present

## 2020-11-30 DIAGNOSIS — R87810 Cervical high risk human papillomavirus (HPV) DNA test positive: Secondary | ICD-10-CM | POA: Diagnosis not present

## 2020-11-30 DIAGNOSIS — Z113 Encounter for screening for infections with a predominantly sexual mode of transmission: Secondary | ICD-10-CM | POA: Diagnosis not present

## 2020-11-30 LAB — HM PAP SMEAR

## 2020-11-30 MED FILL — NORETHINDRONE 0.35 MG TABS: 0.35 | 84 days supply | Qty: 84 | Fill #0

## 2020-11-30 MED FILL — SERTRALINE HCL 25 MG TABLET: 25 | 30 days supply | Qty: 30 | Fill #0

## 2020-12-14 ENCOUNTER — Ambulatory Visit: Payer: 59 | Admitting: Family Medicine

## 2020-12-28 DIAGNOSIS — F3289 Other specified depressive episodes: Secondary | ICD-10-CM | POA: Diagnosis not present

## 2020-12-29 ENCOUNTER — Other Ambulatory Visit (HOSPITAL_BASED_OUTPATIENT_CLINIC_OR_DEPARTMENT_OTHER): Payer: Self-pay

## 2021-01-18 ENCOUNTER — Other Ambulatory Visit (HOSPITAL_COMMUNITY): Payer: Self-pay

## 2021-01-18 MED ORDER — SERTRALINE HCL 50 MG PO TABS
ORAL_TABLET | ORAL | 3 refills | Status: DC
  Filled 2021-01-18: qty 30, 30d supply, fill #0
  Filled 2021-03-10: qty 30, 30d supply, fill #1

## 2021-01-26 ENCOUNTER — Other Ambulatory Visit (HOSPITAL_COMMUNITY): Payer: Self-pay

## 2021-03-10 ENCOUNTER — Other Ambulatory Visit (HOSPITAL_COMMUNITY): Payer: Self-pay

## 2021-03-10 MED FILL — Norethindrone Tab 0.35 MG: ORAL | 28 days supply | Qty: 28 | Fill #0 | Status: AC

## 2021-04-05 ENCOUNTER — Encounter: Payer: Self-pay | Admitting: *Deleted

## 2021-04-17 ENCOUNTER — Other Ambulatory Visit (HOSPITAL_COMMUNITY): Payer: Self-pay

## 2021-04-17 MED FILL — Norethindrone Tab 0.35 MG: ORAL | 84 days supply | Qty: 84 | Fill #1 | Status: AC

## 2021-06-09 ENCOUNTER — Telehealth: Payer: 59 | Admitting: Family Medicine

## 2021-06-09 ENCOUNTER — Telehealth: Payer: Self-pay | Admitting: Family Medicine

## 2021-06-13 NOTE — Progress Notes (Signed)
Err

## 2021-07-17 ENCOUNTER — Ambulatory Visit: Payer: 59 | Admitting: Registered Nurse

## 2021-09-04 DIAGNOSIS — O3680X9 Pregnancy with inconclusive fetal viability, other fetus: Secondary | ICD-10-CM | POA: Diagnosis not present

## 2021-09-06 ENCOUNTER — Other Ambulatory Visit: Payer: Self-pay

## 2021-09-06 ENCOUNTER — Other Ambulatory Visit (HOSPITAL_COMMUNITY)
Admission: RE | Admit: 2021-09-06 | Discharge: 2021-09-06 | Disposition: A | Discharge status: Home / Self Care | Payer: 59 | Source: Ambulatory Visit | Attending: Registered Nurse | Admitting: Registered Nurse

## 2021-09-06 ENCOUNTER — Encounter: Payer: Self-pay | Admitting: Registered Nurse

## 2021-09-06 ENCOUNTER — Ambulatory Visit: Payer: 59 | Admitting: Registered Nurse

## 2021-09-06 VITALS — BP 131/71 | HR 68 | Temp 98.2°F | Resp 18 | Ht 68.0 in | Wt 230.0 lb

## 2021-09-06 DIAGNOSIS — F419 Anxiety disorder, unspecified: Secondary | ICD-10-CM | POA: Diagnosis not present

## 2021-09-06 DIAGNOSIS — R102 Pelvic and perineal pain: Secondary | ICD-10-CM | POA: Insufficient documentation

## 2021-09-06 LAB — POCT URINALYSIS DIPSTICK
Bilirubin, UA: NEGATIVE
Blood, UA: NEGATIVE
Glucose, UA: NEGATIVE
Ketones, UA: NEGATIVE
Nitrite, UA: NEGATIVE
Protein, UA: NEGATIVE
Spec Grav, UA: 1.01 (ref 1.010–1.025)
Urobilinogen, UA: 1 E.U./dL
pH, UA: 6.5 (ref 5.0–8.0)

## 2021-09-06 LAB — POCT URINE PREGNANCY: Preg Test, Ur: NEGATIVE

## 2021-09-06 NOTE — Patient Instructions (Addendum)
Anita Fernandez -   Negative pregnancy test on site today.  Urinalysis shows some white blood cells, but nothing conclusive for UTI. I'll send a culture  We will also check for STI, yeast, and BV. These results will be back in the coming days.  I have ordered a pelvic ultrasound to [facility] at [facility]. You'll get a call to schedule this.  Depending on results, we can decide on further intervention from there  Thank you!  Rich     If you have lab work done today you will be contacted with your lab results within the next 2 weeks.  If you have not heard from [place] then please contact us. The fastest way to get your results is to register for My Chart.   IF you received an x-ray today, you will receive an invoice from [facility]. Please contact [facility] at [phone] with questions or concerns regarding your invoice.   IF you received labwork today, you will receive an invoice from [place]. Please contact [facility] at [phone] with questions or concerns regarding your invoice.   Our billing staff will not be able to assist you with questions regarding bills from these companies.  You will be contacted with the lab results as soon as they are available. The fastest way to get your results is to activate your My Chart account. Instructions are located on the last page of this paperwork. If you have not heard from [place] regarding the results in 2 weeks, please contact this office.

## 2021-09-06 NOTE — Progress Notes (Signed)
Established Patient Office Visit  Subjective:  Patient ID: Anita Fernandez, female    DOB: 10-01-1996  Age: 25 y.o. MRN: [id number]  CC:  Chief Complaint  Patient presents with   Fatigue    Patient states over the last week she has been very fatigue, and feeling like little fluttering feeling in her stomach. Patient has took multiple pregnancy test and they were all negative and even had blood work at [facility].    HPI Anita Fernandez presents for pregnancy test -   Has felt disturbance in abdomen Home preg test negative x 2 Had beta quant drawn through [facility] on 09/04/21 - negative with result <1  LMP - 08/28/21 - 08/31/21. This was lighter than her normal periods, normal length.  Having some back pain/discomfort Frequency in urination Fatigue Feels rolling/flutters in abdomen, states two other people have felt this  Currently sexually active - not seeking pregnancy but "if it happens, it happens".    Ongoing anxiety Requesting referral to counselor Notes upcoming job change as source of stress Denies hi/si Has been on sertraline in past but personal preference to avoid medication  History reviewed. No pertinent past medical history.  History reviewed. No pertinent surgical history.  Family History  Problem Relation Age of Onset   Alcohol abuse Maternal Grandmother    Arthritis Maternal Grandmother    Hyperlipidemia Maternal Grandmother    Heart disease Maternal Grandmother    Hyperlipidemia Maternal Grandfather    Heart disease Maternal Grandfather    Diabetes Maternal Grandfather    Hyperlipidemia Paternal Grandmother    Heart disease Paternal Grandmother    Hyperlipidemia Paternal Grandfather    Heart disease Paternal Grandfather    Diabetes Paternal Grandfather    Healthy Mother    Healthy Father     Social History   Socioeconomic History   Marital status: Single    Spouse name: Not on file   Number of children: Not on file   Years of education:  Not on file   Highest education level: Not on file  Occupational History   Not on file  Tobacco Use   Smoking status: Never   Smokeless tobacco: Never  Vaping Use   Vaping Use: Never used  Substance and Sexual Activity   Alcohol use: Yes    Comment: socially   Drug use: No   Sexual activity: Never  Other Topics Concern   Not on file  Social History Narrative   Not on file   Social Determinants of Health   Financial Resource Strain: Not on file  Food Insecurity: Not on file  Transportation Needs: Not on file  Physical Activity: Not on file  Stress: Not on file  Social Connections: Not on file  Intimate Partner Violence: Not on file    Outpatient Medications Prior to Visit  Medication Sig Dispense Refill   azithromycin (ZITHROMAX) 250 MG tablet Take 2 tablets on Day 1. Then take 1 tablet daily. 6 tablet 0   magic mouthwash w/lidocaine SOLN Take 10 mLs by mouth 3 (three) times daily as needed for mouth pain. Swish, gargle for 30-60 seconds then spit 100 mL 0   norethindrone (MICRONOR) 0.35 MG tablet Take 1 tablet by mouth daily.     norethindrone (MICRONOR) 0.35 MG tablet TAKE 1 TABLET BY MOUTH ONCE DAILY 28 tablet 14   norethindrone (MICRONOR) 0.35 MG tablet TAKE 1 TABLET BY MOUTH ONCE DAILY 84 tablet 1   sertraline (ZOLOFT) 25 MG tablet TAKE 1 TABLET  BY MOUTH ONCE DAILY 30 tablet 1   sertraline (ZOLOFT) 50 MG tablet Take 1 tablet every day by oral route for 30 days. 30 tablet 3   No facility-administered medications prior to visit.    No Known Allergies  ROS Review of Systems  Constitutional: Negative.   HENT: Negative.    Eyes: Negative.   Respiratory: Negative.    Cardiovascular: Negative.   Gastrointestinal: Negative.   Genitourinary:  Positive for frequency, pelvic pain and urgency.  Musculoskeletal:  Positive for back pain.  Skin: Negative.   Neurological: Negative.   Psychiatric/Behavioral: Negative.    All other systems reviewed and are negative.     Objective:    Physical Exam Vitals and nursing note reviewed.  Constitutional:      General: She is not in acute distress.    Appearance: Normal appearance. She is normal weight. She is not ill-appearing, toxic-appearing or diaphoretic.  Cardiovascular:     Rate and Rhythm: Normal rate and regular rhythm.     Heart sounds: Normal heart sounds. No murmur heard.   No friction rub. No gallop.  Pulmonary:     Effort: Pulmonary effort is normal. No respiratory distress.     Breath sounds: Normal breath sounds. No stridor. No wheezing, rhonchi or rales.  Chest:     Chest wall: No tenderness.  Abdominal:     General: Abdomen is flat. Bowel sounds are normal. There is no distension.     Palpations: There is no mass.     Tenderness: There is no abdominal tenderness. There is no right CVA tenderness, left CVA tenderness, guarding or rebound.     Hernia: No hernia is present.  Skin:    General: Skin is warm and dry.  Neurological:     General: No focal deficit present.     Mental Status: She is alert and oriented to person, place, and time. Mental status is at baseline.  Psychiatric:        Mood and Affect: Mood normal.        Behavior: Behavior normal.        Thought Content: Thought content normal.        Judgment: Judgment normal.    BP 131/71   Pulse 68   Temp 98.2 F (36.8 C) (Temporal)   Resp 18   Ht 5\' 8"  (1.727 m)   Wt 230 lb (104.3 kg)   SpO2 99%   BMI 34.97 kg/m  Wt Readings from Last 3 Encounters:  09/06/21 230 lb (104.3 kg)  02/05/19 194 lb (88 kg)  11/19/18 198 lb 6.4 oz (90 kg)     Health Maintenance Due  Topic Date Due   COVID-19 Vaccine (1) Never done   Pneumococcal Vaccine 49-37 Years old (1 - PCV) Never done   HIV Screening  Never done   Hepatitis C Screening  Never done    There are no preventive care reminders to display for this patient.  Lab Results  Component Value Date   TSH 1.00 02/06/2019   Lab Results  Component Value Date   WBC 5.5  02/06/2019   HGB 11.8 (L) 02/06/2019   HCT 34.8 (L) 02/06/2019   MCV 77.1 (L) 02/06/2019   PLT 258.0 02/06/2019   Lab Results  Component Value Date   NA 142 11/29/2015   K 4.1 11/29/2015   BUN 9 11/29/2015   CREATININE 0.6 11/29/2015   No results found for: CHOL No results found for: HDL No results found for:  LDLCALC No results found for: TRIG No results found for: CHOLHDL No results found for: OZDG6Y    Assessment & Plan:   Problem List Items Addressed This Visit   None Visit Diagnoses     Pelvic pain    -  Primary   Relevant Orders   POCT Urinalysis Dipstick (Completed)   Urine cytology ancillary only([place])   US Pelvis Complete   POCT urine pregnancy (Completed)   Urine Culture   Anxiety       Relevant Orders   Ambulatory referral to Psychology       No orders of the defined types were placed in this encounter.   Follow-up: Return if symptoms worsen or fail to improve.   PLAN Poct pregnancy test negative. Poct UA shows trace leukocytes. Will send culture. Sending urine cytology Order pelvic US - exam limited by body habitus Refer to counseling Patient encouraged to call clinic with any questions, comments, or concerns.  Janeece Agee, NP

## 2021-09-08 ENCOUNTER — Other Ambulatory Visit: Payer: Self-pay | Admitting: Registered Nurse

## 2021-09-08 DIAGNOSIS — R102 Pelvic and perineal pain: Secondary | ICD-10-CM

## 2021-09-08 LAB — URINE CYTOLOGY ANCILLARY ONLY
Bacterial Vaginitis-Urine: NEGATIVE
Candida Urine: NEGATIVE
Chlamydia: NEGATIVE
Comment: NEGATIVE
Comment: NEGATIVE
Comment: NORMAL
Neisseria Gonorrhea: NEGATIVE
Trichomonas: NEGATIVE

## 2021-09-08 LAB — URINE CULTURE
MICRO NUMBER:: 12699472
SPECIMEN QUALITY:: ADEQUATE

## 2021-09-18 ENCOUNTER — Other Ambulatory Visit: Payer: 59

## 2021-09-19 ENCOUNTER — Other Ambulatory Visit: Payer: Self-pay | Admitting: Registered Nurse

## 2021-09-19 ENCOUNTER — Ambulatory Visit
Admission: RE | Admit: 2021-09-19 | Discharge: 2021-09-19 | Disposition: A | Discharge status: Home / Self Care | Payer: 59 | Source: Ambulatory Visit | Attending: Registered Nurse | Admitting: Registered Nurse

## 2021-09-19 DIAGNOSIS — R102 Pelvic and perineal pain: Secondary | ICD-10-CM

## 2021-12-13 DIAGNOSIS — Z3687 Encounter for antenatal screening for uncertain dates: Secondary | ICD-10-CM | POA: Diagnosis not present

## 2021-12-13 DIAGNOSIS — Z114 Encounter for screening for human immunodeficiency virus [HIV]: Secondary | ICD-10-CM | POA: Diagnosis not present

## 2021-12-13 DIAGNOSIS — Z6833 Body mass index (BMI) 33.0-33.9, adult: Secondary | ICD-10-CM | POA: Diagnosis not present

## 2021-12-13 DIAGNOSIS — O99211 Obesity complicating pregnancy, first trimester: Secondary | ICD-10-CM | POA: Diagnosis not present

## 2021-12-13 DIAGNOSIS — Z3481 Encounter for supervision of other normal pregnancy, first trimester: Secondary | ICD-10-CM | POA: Diagnosis not present

## 2021-12-13 DIAGNOSIS — Z3A08 8 weeks gestation of pregnancy: Secondary | ICD-10-CM | POA: Diagnosis not present

## 2022-01-10 DIAGNOSIS — Z36 Encounter for antenatal screening for chromosomal anomalies: Secondary | ICD-10-CM | POA: Diagnosis not present

## 2022-01-10 DIAGNOSIS — Z113 Encounter for screening for infections with a predominantly sexual mode of transmission: Secondary | ICD-10-CM | POA: Diagnosis not present

## 2022-01-10 DIAGNOSIS — Z3682 Encounter for antenatal screening for nuchal translucency: Secondary | ICD-10-CM | POA: Diagnosis not present

## 2022-01-10 DIAGNOSIS — Z3A12 12 weeks gestation of pregnancy: Secondary | ICD-10-CM | POA: Diagnosis not present

## 2022-02-07 DIAGNOSIS — Z363 Encounter for antenatal screening for malformations: Secondary | ICD-10-CM | POA: Diagnosis not present

## 2022-02-07 DIAGNOSIS — O26819 Pregnancy related exhaustion and fatigue, unspecified trimester: Secondary | ICD-10-CM | POA: Diagnosis not present

## 2022-02-13 ENCOUNTER — Other Ambulatory Visit (HOSPITAL_COMMUNITY): Payer: Self-pay

## 2022-02-13 DIAGNOSIS — Z3A16 16 weeks gestation of pregnancy: Secondary | ICD-10-CM | POA: Diagnosis not present

## 2022-02-13 DIAGNOSIS — R1011 Right upper quadrant pain: Secondary | ICD-10-CM | POA: Diagnosis not present

## 2022-03-07 DIAGNOSIS — Z363 Encounter for antenatal screening for malformations: Secondary | ICD-10-CM | POA: Diagnosis not present

## 2022-03-07 DIAGNOSIS — Z3A20 20 weeks gestation of pregnancy: Secondary | ICD-10-CM | POA: Diagnosis not present

## 2022-03-07 DIAGNOSIS — O99212 Obesity complicating pregnancy, second trimester: Secondary | ICD-10-CM | POA: Diagnosis not present

## 2022-04-04 ENCOUNTER — Encounter: Payer: Self-pay | Admitting: Family Medicine

## 2022-04-04 LAB — HM PAP SMEAR

## 2022-04-09 DIAGNOSIS — Z3483 Encounter for supervision of other normal pregnancy, third trimester: Secondary | ICD-10-CM | POA: Diagnosis not present

## 2022-05-02 DIAGNOSIS — Z3A28 28 weeks gestation of pregnancy: Secondary | ICD-10-CM | POA: Diagnosis not present

## 2022-05-02 DIAGNOSIS — Z23 Encounter for immunization: Secondary | ICD-10-CM | POA: Diagnosis not present

## 2022-05-02 DIAGNOSIS — Z3483 Encounter for supervision of other normal pregnancy, third trimester: Secondary | ICD-10-CM | POA: Diagnosis not present

## 2022-05-02 DIAGNOSIS — Z3402 Encounter for supervision of normal first pregnancy, second trimester: Secondary | ICD-10-CM | POA: Diagnosis not present

## 2022-05-23 ENCOUNTER — Encounter: Payer: Self-pay | Admitting: Family Medicine

## 2022-05-23 DIAGNOSIS — O26843 Uterine size-date discrepancy, third trimester: Secondary | ICD-10-CM | POA: Diagnosis not present

## 2022-05-23 DIAGNOSIS — O99213 Obesity complicating pregnancy, third trimester: Secondary | ICD-10-CM | POA: Diagnosis not present

## 2022-05-23 DIAGNOSIS — Z3A31 31 weeks gestation of pregnancy: Secondary | ICD-10-CM | POA: Diagnosis not present

## 2022-05-23 LAB — HM PAP SMEAR

## 2022-05-29 DIAGNOSIS — Z3483 Encounter for supervision of other normal pregnancy, third trimester: Secondary | ICD-10-CM | POA: Diagnosis not present

## 2022-05-29 DIAGNOSIS — Z3482 Encounter for supervision of other normal pregnancy, second trimester: Secondary | ICD-10-CM | POA: Diagnosis not present

## 2022-06-06 DIAGNOSIS — O3663X0 Maternal care for excessive fetal growth, third trimester, not applicable or unspecified: Secondary | ICD-10-CM | POA: Diagnosis not present

## 2022-06-06 DIAGNOSIS — O99213 Obesity complicating pregnancy, third trimester: Secondary | ICD-10-CM | POA: Diagnosis not present

## 2022-06-06 DIAGNOSIS — Z3A33 33 weeks gestation of pregnancy: Secondary | ICD-10-CM | POA: Diagnosis not present

## 2022-06-18 DIAGNOSIS — O99019 Anemia complicating pregnancy, unspecified trimester: Secondary | ICD-10-CM | POA: Diagnosis not present

## 2022-06-30 DIAGNOSIS — R059 Cough, unspecified: Secondary | ICD-10-CM | POA: Diagnosis not present

## 2022-06-30 DIAGNOSIS — Z20822 Contact with and (suspected) exposure to covid-19: Secondary | ICD-10-CM | POA: Diagnosis not present

## 2022-07-03 DIAGNOSIS — Z3685 Encounter for antenatal screening for Streptococcus B: Secondary | ICD-10-CM | POA: Diagnosis not present

## 2022-07-03 DIAGNOSIS — Z118 Encounter for screening for other infectious and parasitic diseases: Secondary | ICD-10-CM | POA: Diagnosis not present

## 2022-07-03 DIAGNOSIS — Z3483 Encounter for supervision of other normal pregnancy, third trimester: Secondary | ICD-10-CM | POA: Diagnosis not present

## 2022-07-03 DIAGNOSIS — Z113 Encounter for screening for infections with a predominantly sexual mode of transmission: Secondary | ICD-10-CM | POA: Diagnosis not present

## 2022-07-05 DIAGNOSIS — O99019 Anemia complicating pregnancy, unspecified trimester: Secondary | ICD-10-CM | POA: Insufficient documentation

## 2022-07-09 DIAGNOSIS — D509 Iron deficiency anemia, unspecified: Secondary | ICD-10-CM | POA: Diagnosis not present

## 2022-07-09 DIAGNOSIS — O99019 Anemia complicating pregnancy, unspecified trimester: Secondary | ICD-10-CM | POA: Diagnosis not present

## 2022-07-10 DIAGNOSIS — O99019 Anemia complicating pregnancy, unspecified trimester: Secondary | ICD-10-CM | POA: Diagnosis not present

## 2022-07-10 DIAGNOSIS — O26843 Uterine size-date discrepancy, third trimester: Secondary | ICD-10-CM | POA: Diagnosis not present

## 2022-07-10 DIAGNOSIS — O99213 Obesity complicating pregnancy, third trimester: Secondary | ICD-10-CM | POA: Diagnosis not present

## 2022-07-10 DIAGNOSIS — Z3A37 37 weeks gestation of pregnancy: Secondary | ICD-10-CM | POA: Diagnosis not present

## 2022-07-10 DIAGNOSIS — D509 Iron deficiency anemia, unspecified: Secondary | ICD-10-CM | POA: Diagnosis not present

## 2022-07-24 DIAGNOSIS — O99213 Obesity complicating pregnancy, third trimester: Secondary | ICD-10-CM | POA: Insufficient documentation

## 2022-07-24 DIAGNOSIS — N858 Other specified noninflammatory disorders of uterus: Secondary | ICD-10-CM | POA: Diagnosis not present

## 2022-07-24 DIAGNOSIS — Z3A39 39 weeks gestation of pregnancy: Secondary | ICD-10-CM | POA: Diagnosis not present

## 2022-07-24 DIAGNOSIS — R102 Pelvic and perineal pain: Secondary | ICD-10-CM | POA: Diagnosis not present

## 2022-07-24 DIAGNOSIS — O26893 Other specified pregnancy related conditions, third trimester: Secondary | ICD-10-CM | POA: Diagnosis not present

## 2022-07-24 DIAGNOSIS — D509 Iron deficiency anemia, unspecified: Secondary | ICD-10-CM | POA: Diagnosis not present

## 2022-07-24 DIAGNOSIS — O9902 Anemia complicating childbirth: Secondary | ICD-10-CM | POA: Diagnosis not present

## 2022-07-24 DIAGNOSIS — O99214 Obesity complicating childbirth: Secondary | ICD-10-CM | POA: Diagnosis not present

## 2022-07-24 DIAGNOSIS — O99893 Other specified diseases and conditions complicating puerperium: Secondary | ICD-10-CM | POA: Diagnosis not present

## 2022-08-21 DIAGNOSIS — E669 Obesity, unspecified: Secondary | ICD-10-CM | POA: Diagnosis not present

## 2022-08-21 DIAGNOSIS — Z302 Encounter for sterilization: Secondary | ICD-10-CM | POA: Diagnosis not present

## 2022-08-21 DIAGNOSIS — Z6837 Body mass index (BMI) 37.0-37.9, adult: Secondary | ICD-10-CM | POA: Diagnosis not present

## 2022-10-04 DIAGNOSIS — R87810 Cervical high risk human papillomavirus (HPV) DNA test positive: Secondary | ICD-10-CM | POA: Diagnosis not present

## 2022-11-19 IMAGING — US US PELVIS COMPLETE
1 series · 14 of 25 positions shown · non-contrast
Comparison: None.

CLINICAL DATA: Pelvic pain

EXAM:
TRANSABDOMINAL ULTRASOUND OF PELVIS
TECHNIQUE: Transabdominal ultrasound examination of the pelvis was performed
including evaluation of the uterus, ovaries, adnexal regions, and
pelvic cul-de-sac.

[Series 1: us pelvis complete · 0.28mm/px · 14 of 32 slices shown]
[im 1/32]
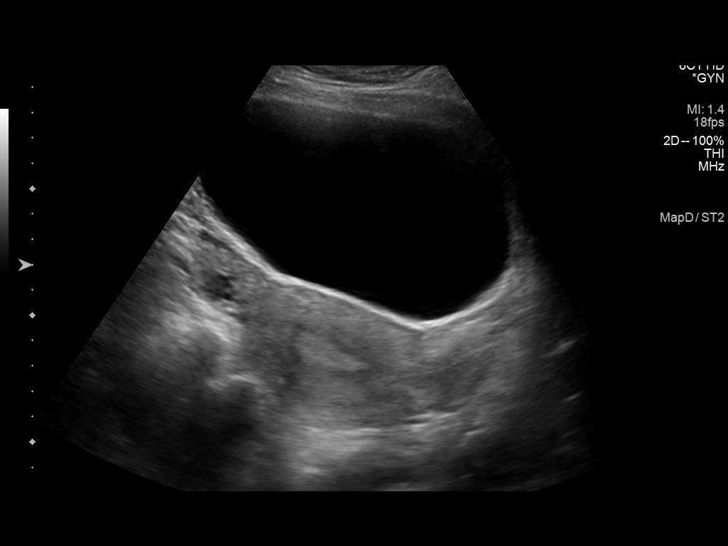
[im 3/32]
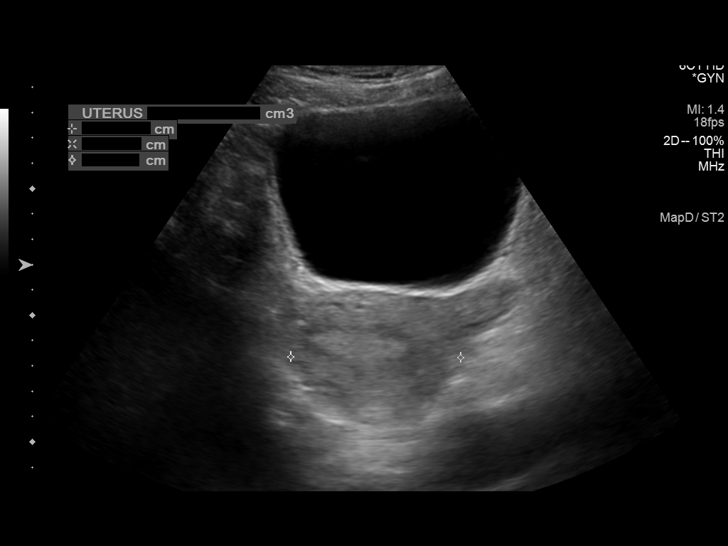
[im 6/32]
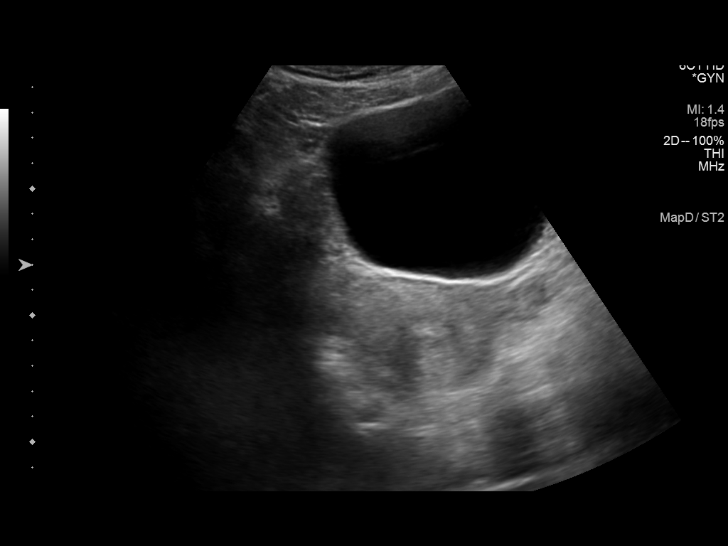
[im 8/32]
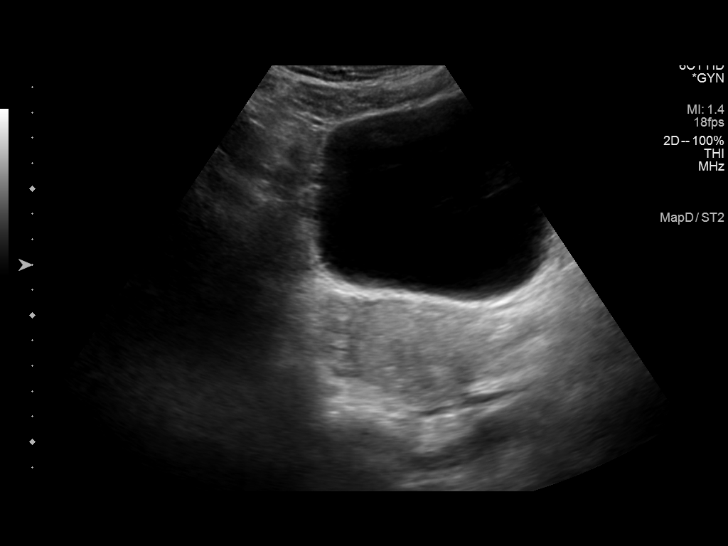
[im 11/32]
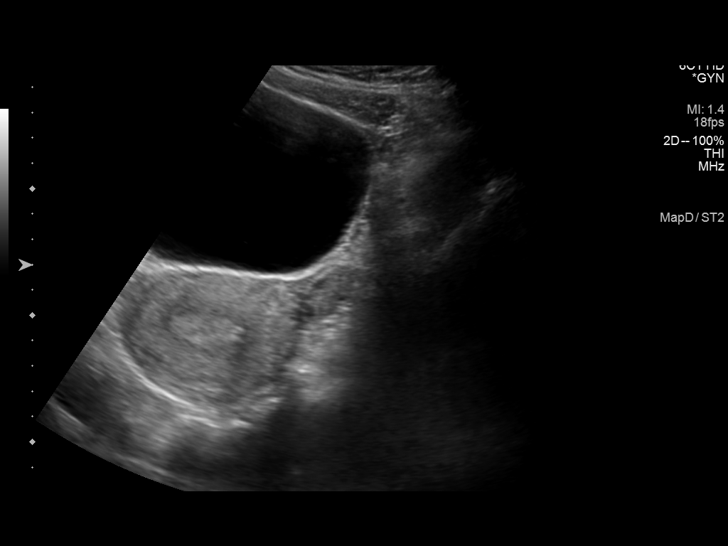
[im 12/32]
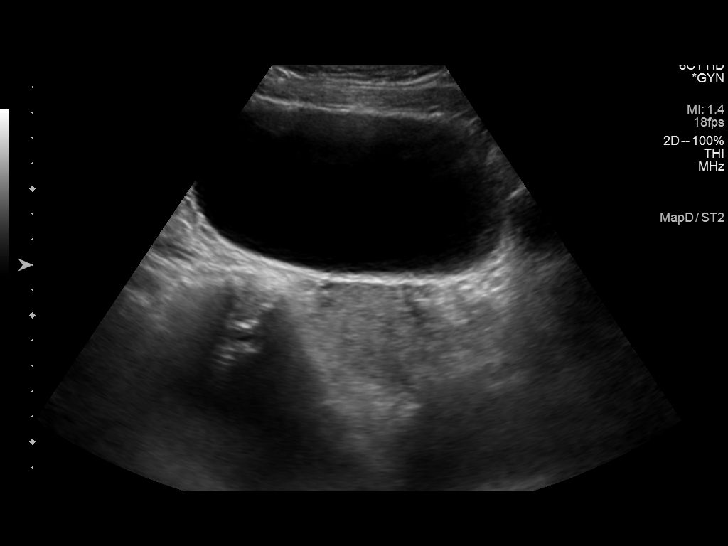
[im 15/32]
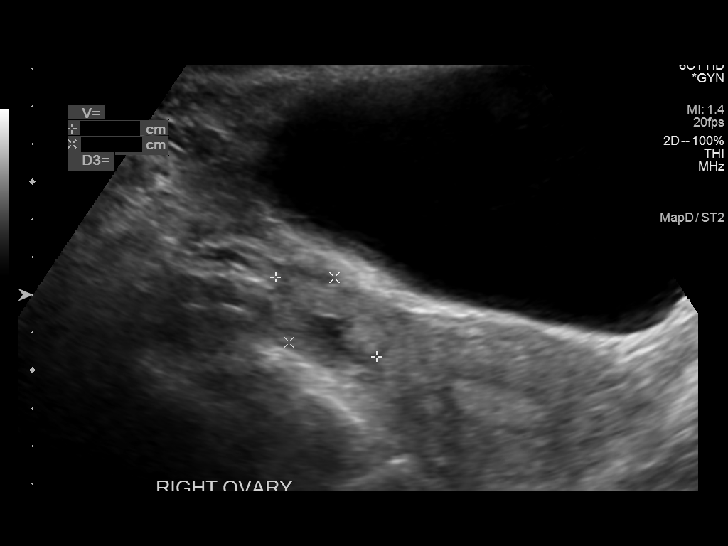
[im 17/32]
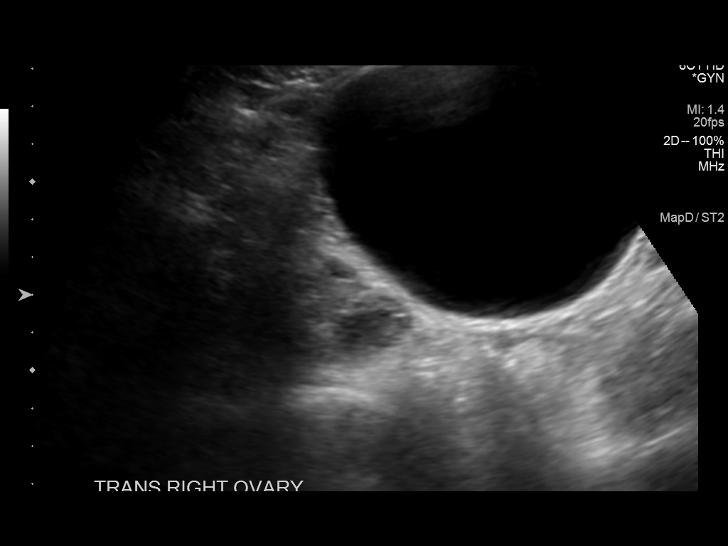
[im 20/32]
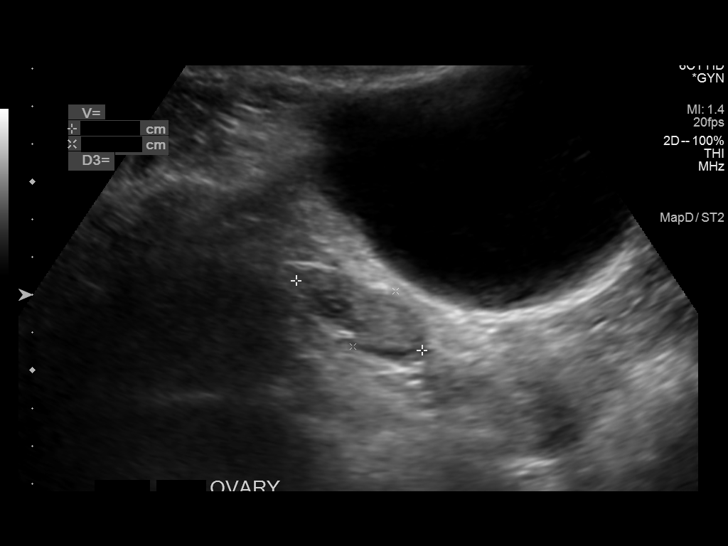
[im 21/32]
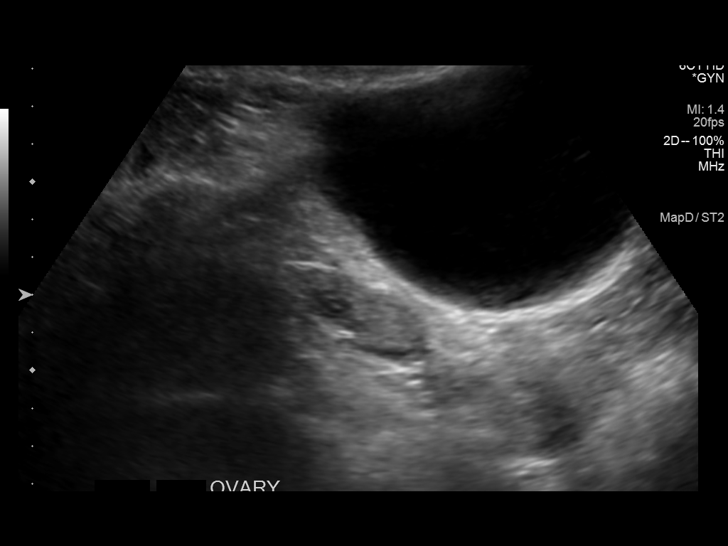
[im 24/32]
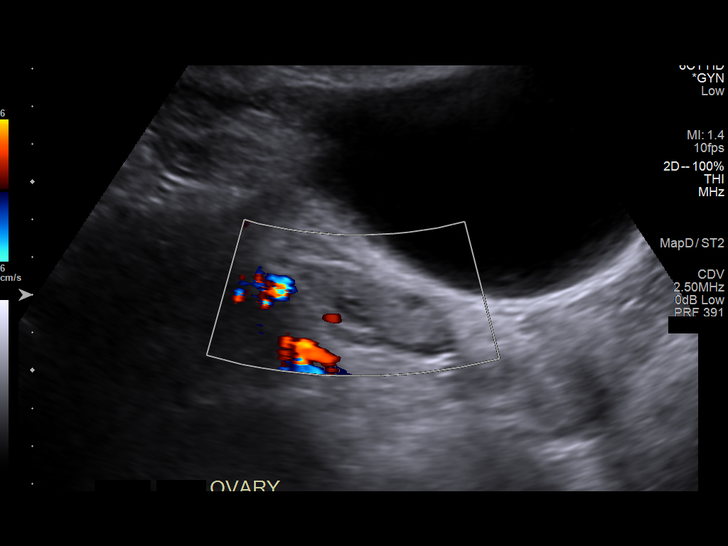
[im 26/32]
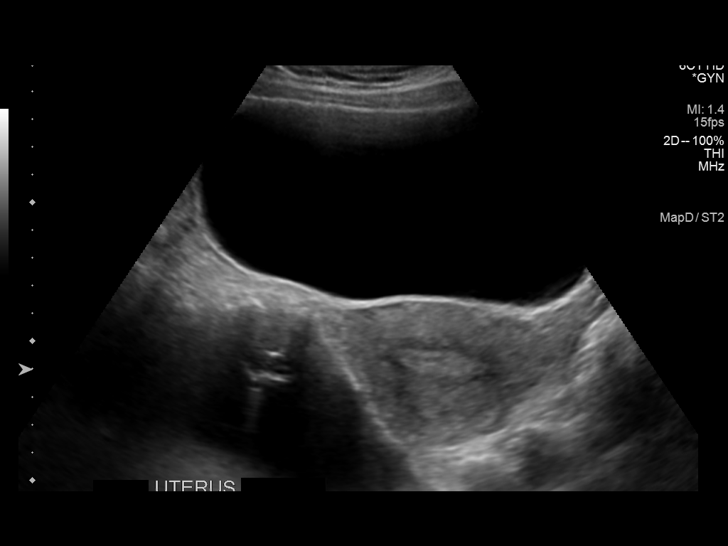
[im 29/32]
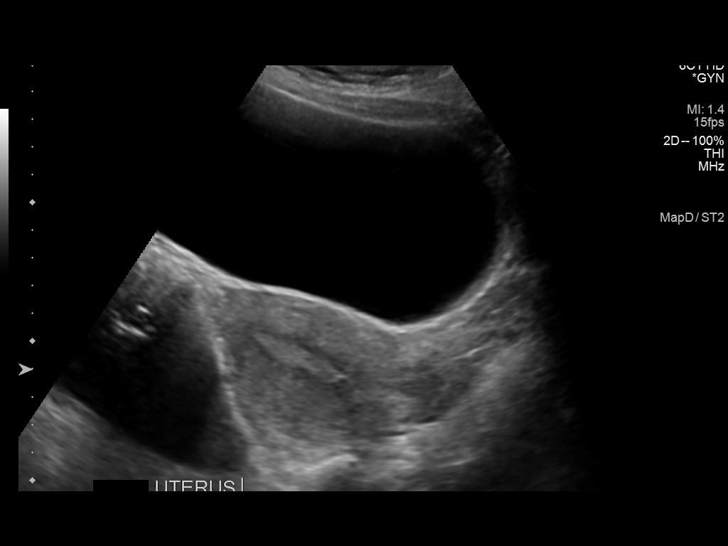
[im 32/32]
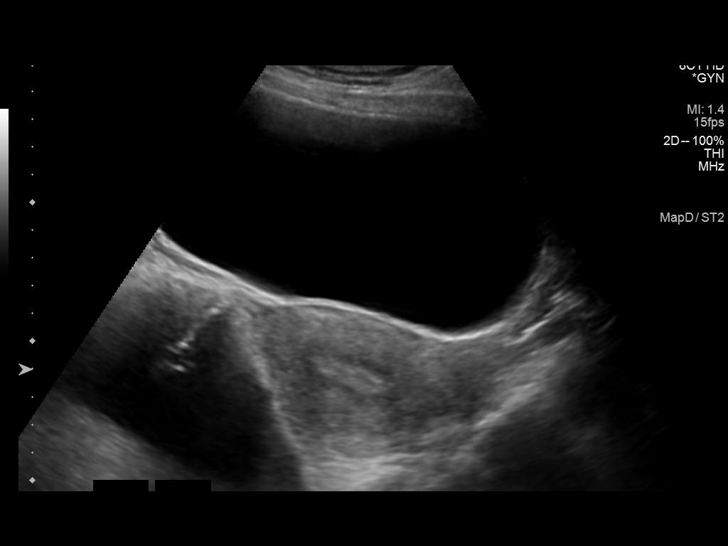

[14 of 25 positions shown; findings below may reference images not displayed]

FINDINGS: Uterus

Measurements: 10.1 x 5.1 x 6.7 cm = volume: 180.6 mL. No fibroids or
other mass visualized.

Endometrium

Thickness: 11.3 mm.  No focal abnormality visualized.

Right ovary

Measurements: 4.4 x 2.8 x 3 cm = volume: 11.2 mL. Normal
appearance/no adnexal mass.

Left ovary

Measurements: 3.8 x 2 x 1.7 cm = volume: 6.2 mL. Normal
appearance/no adnexal mass.

Other findings:  No abnormal free fluid.
IMPRESSION: Negative transabdominal pelvic ultrasound.

## 2023-11-05 ENCOUNTER — Encounter: Payer: Self-pay | Admitting: Emergency Medicine

## 2023-11-05 ENCOUNTER — Ambulatory Visit
Admission: EM | Admit: 2023-11-05 | Discharge: 2023-11-05 | Disposition: A | Discharge status: Home / Self Care | Payer: Medicaid Other | Attending: Emergency Medicine | Admitting: Emergency Medicine

## 2023-11-05 DIAGNOSIS — M67912 Unspecified disorder of synovium and tendon, left shoulder: Secondary | ICD-10-CM

## 2023-11-05 MED ORDER — IBUPROFEN 600 MG PO TABS: 600.0000 mg | ORAL_TABLET | Freq: Three times a day (TID) | ORAL | 0 refills | Status: DC | PRN

## 2023-11-05 NOTE — ED Provider Notes (Signed)
[facility]    CSN: [id number] Arrival date & time: 11/05/23  1748    HISTORY   Chief Complaint  Patient presents with   Shoulder Pain   HPI Anita Fernandez is a pleasant, 28 y.o. female who presents to urgent care today. Patient states that 2 days ago she was doing some shoulder presses and pull-ups, denies specific injury but noticed today that her left shoulder is making a popping sound and is uncomfortable, denies pain, and she has noticed some intermittent episodes of numbness in her left hand.  Patient states she has not tried eating to alleviate these symptoms.  Patient states she is never had these symptoms before.  The history is provided by the patient.   History reviewed. No pertinent past medical history. Patient Active Problem List   Diagnosis Date Noted   Insomnia 11/29/2015   Fatigue 11/29/2015   Vaginitis and vulvovaginitis 12/15/2014   Lumbar back pain 06/23/2012   Sinus headache 06/23/2012   Past Surgical History:  Procedure Laterality Date   TUBAL LIGATION     OB History   No obstetric history on file.    Home Medications    Prior to Admission medications   Medication Sig Start Date End Date Taking? Authorizing Provider  ibuprofen (ADVIL) 600 MG tablet Take 1 tablet (600 mg total) by mouth every 8 (eight) hours as needed for up to 30 doses for fever, headache, mild pain (pain score 1-3) or moderate pain (pain score 4-6) (Inflammation). Take 1 tablet 3 times daily as needed for inflammation of upper airways and/or pain. 11/05/23  Yes Theadora Rama Scales, PA-C    Family History Family History  Problem Relation Age of Onset   Alcohol abuse Maternal Grandmother    Arthritis Maternal Grandmother    Hyperlipidemia Maternal Grandmother    Heart disease Maternal Grandmother    Hyperlipidemia Maternal Grandfather    Heart disease Maternal Grandfather    Diabetes Maternal Grandfather    Hyperlipidemia Paternal Grandmother    Heart disease  Paternal Grandmother    Hyperlipidemia Paternal Grandfather    Heart disease Paternal Grandfather    Diabetes Paternal Grandfather    Healthy Mother    Healthy Father    Social History Social History   Tobacco Use   Smoking status: Never   Smokeless tobacco: Never  Vaping Use   Vaping status: Never Used  Substance Use Topics   Alcohol use: Yes    Comment: socially   Drug use: No   Allergies   Patient has no known allergies.  Review of Systems Review of Systems Pertinent findings revealed after performing a 14 point review of systems has been noted in the history of present illness.  Physical Exam Vital Signs BP 112/76 (BP Location: Right Arm)   Pulse 71   Temp 98.2 F (36.8 C) (Oral)   Resp 17   LMP 10/17/2023   SpO2 98%   No data found.  Physical Exam Vitals and nursing note reviewed.  Constitutional:      General: She is not in acute distress.    Appearance: Normal appearance.  HENT:     Head: Normocephalic and atraumatic.  Eyes:     Pupils: Pupils are equal, round, and reactive to light.  Cardiovascular:     Rate and Rhythm: Normal rate and regular rhythm.  Pulmonary:     Effort: Pulmonary effort is normal.     Breath sounds: Normal breath sounds.  Musculoskeletal:  General: Normal range of motion.     Left shoulder: Tenderness present. No swelling, deformity, bony tenderness or crepitus. Normal range of motion. Normal strength.     Cervical back: Normal range of motion and neck supple.  Skin:    General: Skin is warm and dry.  Neurological:     General: No focal deficit present.     Mental Status: She is alert and oriented to person, place, and time. Mental status is at baseline.     Sensory: Sensation is intact.     Motor: Motor function is intact.     Coordination: Coordination is intact.  Psychiatric:        Mood and Affect: Mood normal.        Behavior: Behavior normal.        Thought Content: Thought content normal.        Judgment:  Judgment normal.     UC Couse / Diagnostics / Procedures:     Radiology No results found.  Procedures Procedures (including critical care time) EKG  Pending results:  Labs Reviewed - No data to display  Medications Ordered in UC: Medications - No data to display  UC Diagnoses / Final Clinical Impressions(s)   I have reviewed the triage vital signs and the nursing notes.  Pertinent labs & imaging results that were available during my care of the patient were reviewed by me and considered in my medical decision making (see chart for details).    Final diagnoses:  Tendinopathy of left rotator cuff   Patient was advised to: Take Ibuprofen 600 mg 3 times daily for the next 5 to 7 days Apply ice pack to affected area 4 times daily for 20 minutes each time Avoid strengthening exercises until pain is completely resolved.  Gentle range of motion exercises provided with AVS.  Return precautions advised  Please see discharge instructions below for details of plan of care as provided to patient. ED Prescriptions     Medication Sig Dispense Auth. Provider   ibuprofen (ADVIL) 600 MG tablet Take 1 tablet (600 mg total) by mouth every 8 (eight) hours as needed for up to 30 doses for fever, headache, mild pain (pain score 1-3) or moderate pain (pain score 4-6) (Inflammation). Take 1 tablet 3 times daily as needed for inflammation of upper airways and/or pain. 30 tablet Theadora Rama Scales, PA-C      PDMP not reviewed this encounter.    Discharge Instructions      The mainstay of therapy for musculoskeletal pain is reduction of inflammation and relaxation of tension which is causing inflammation.  Keep in mind, pain always begets more pain.  To help you stay ahead of your pain and inflammation, I have provided the following regimen for you:   When you pick up your prescription from the pharmacy, please begin taking ibuprofen 600 mg 3 times daily.  Please keep in mind that it is  always easier to treat a little bit of pain that is to treat a lot of pain.  I recommend that for the next several days, you take this medication on a scheduled basis.  After that, take it when you begin to feel the pain returning, do not wait until you are in a lot of pain.   During the day, please set aside time to apply ice to the affected area 4 times daily for 20 minutes each application.  This can be achieved by using a bag of frozen peas or corn, a  Ziploc bag filled with ice and water, or Ziploc bag filled with half rubbing alcohol and half Dawn dish detergent, frozen into a slush.  Please be careful not to apply ice directly to your skin, always place a soft cloth between you and the ice pack.  Over-the-counter products such as IcyHot and Biofreeze do not work nearly as well.   Please avoid attempts to strengthen the affected area until you are feeling completely pain-free.  I have enclosed some gentle range of motion exercises that you can perform as tolerated to keep your shoulder loose and to avoid developing scar tissue that may limit your range of motion in the future.  If you are not feeling better in the next 7 to 10 days, I recommend that you reach out either to your primary care provider or see an orthopedic specialist for further evaluation.  Advanced imaging may be indicated.  Thank you for visiting [facility] today.  We appreciate the opportunity to participate in your care.     Disposition Upon Discharge:  Condition: stable for discharge home Home: take medications as prescribed; routine discharge instructions as discussed; follow up as advised.  Patient presented with an acute illness with associated systemic symptoms and significant discomfort requiring urgent management. In my opinion, this is a condition that a prudent lay person (someone who possesses an average knowledge of health and medicine) may potentially expect to result in complications if not addressed  urgently such as respiratory distress, impairment of bodily function or dysfunction of bodily organs.   Routine symptom specific, illness specific and/or disease specific instructions were discussed with the patient and/or caregiver at length.   As such, the patient has been evaluated and assessed, work-up was performed and treatment was provided in alignment with urgent care protocols and evidence based medicine.  Patient/parent/caregiver has been advised that the patient may require follow up for further testing and treatment if the symptoms continue in spite of treatment, as clinically indicated and appropriate.  Patient/parent/caregiver has been advised to report to orthopedic urgent care clinic or return to the [facility] or PCP in 3-5 days if no better; follow-up with orthopedics, PCP or the Emergency Department if new signs and symptoms develop or if the current signs or symptoms continue to change or worsen for further workup, evaluation and treatment as clinically indicated and appropriate  The patient will follow up with their current PCP if and as advised. If the patient does not currently have a PCP we will have assisted them in obtaining one.   The patient may need specialty follow up if the symptoms continue, in spite of conservative treatment and management, for further workup, evaluation, consultation and treatment as clinically indicated and appropriate.  Patient/parent/caregiver verbalized understanding and agreement of plan as discussed.  All questions were addressed during visit.  Please see discharge instructions below for further details of plan.  This office note has been dictated using Teaching laboratory technician.  Unfortunately, this method of dictation can sometimes lead to typographical or grammatical errors.  I apologize for your inconvenience in advance if this occurs.  Please do not hesitate to reach out to me if clarification is needed.      Theadora Rama Scales,  PA-C 11/05/23 1844

## 2023-11-05 NOTE — ED Triage Notes (Signed)
Pt believes injured left shoulder when working out 2 days ago. Reports some numbness in left hand intermittently.

## 2023-11-05 NOTE — Discharge Instructions (Addendum)
The mainstay of therapy for musculoskeletal pain is reduction of inflammation and relaxation of tension which is causing inflammation.  Keep in mind, pain always begets more pain.  To help you stay ahead of your pain and inflammation, I have provided the following regimen for you:   When you pick up your prescription from the pharmacy, please begin taking ibuprofen 600 mg 3 times daily.  Please keep in mind that it is always easier to treat a little bit of pain that is to treat a lot of pain.  I recommend that for the next several days, you take this medication on a scheduled basis.  After that, take it when you begin to feel the pain returning, do not wait until you are in a lot of pain.   During the day, please set aside time to apply ice to the affected area 4 times daily for 20 minutes each application.  This can be achieved by using a bag of frozen peas or corn, a Ziploc bag filled with ice and water, or Ziploc bag filled with half rubbing alcohol and half Dawn dish detergent, frozen into a slush.  Please be careful not to apply ice directly to your skin, always place a soft cloth between you and the ice pack.  Over-the-counter products such as IcyHot and Biofreeze do not work nearly as well.   Please avoid attempts to strengthen the affected area until you are feeling completely pain-free.  I have enclosed some gentle range of motion exercises that you can perform as tolerated to keep your shoulder loose and to avoid developing scar tissue that may limit your range of motion in the future.  If you are not feeling better in the next 7 to 10 days, I recommend that you reach out either to your primary care provider or see an orthopedic specialist for further evaluation.  Advanced imaging may be indicated.  Thank you for visiting [facility] today.  We appreciate the opportunity to participate in your care.

## 2024-06-10 ENCOUNTER — Ambulatory Visit
Admission: EM | Admit: 2024-06-10 | Discharge: 2024-06-10 | Disposition: A | Discharge status: Home / Self Care | Attending: Emergency Medicine | Admitting: Emergency Medicine

## 2024-06-10 DIAGNOSIS — M62830 Muscle spasm of back: Secondary | ICD-10-CM | POA: Diagnosis not present

## 2024-06-10 DIAGNOSIS — M545 Low back pain, unspecified: Secondary | ICD-10-CM | POA: Diagnosis not present

## 2024-06-10 HISTORY — DX: Other specified health status: Z78.9

## 2024-06-10 MED ORDER — BACLOFEN 10 MG PO TABS: 10.0000 mg | ORAL_TABLET | Freq: Three times a day (TID) | ORAL | 0 refills | Status: AC

## 2024-06-10 MED ORDER — IBUPROFEN 800 MG PO TABS: 800.0000 mg | ORAL_TABLET | Freq: Three times a day (TID) | ORAL | 0 refills | Status: AC | PRN

## 2024-06-10 MED ORDER — KETOROLAC TROMETHAMINE 30 MG/ML IJ SOLN
30.0000 mg | Freq: Once | INTRAMUSCULAR | Status: AC
  Administered 2024-06-10: 30 mg via INTRAMUSCULAR
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
## 2024-06-10 NOTE — Discharge Instructions (Signed)
 The mainstay of therapy for musculoskeletal pain is reduction of inflammation and relaxation of tension which is causing inflammation.  Keep in mind, pain always begets more pain.  To help you stay ahead of your pain and inflammation, I have provided the following regimen for you:   During your visit today, you received an injection of ketorolac , high-dose nonsteroidal anti-inflammatory pain medication that should significantly reduce your pain for the next 6 to 8 hours.   When you pick up your prescription from the pharmacy, please begin taking baclofen  10 mg.  This is a highly effective muscle relaxer and antispasmodic which should continue to provide you with relaxation of your tense muscles, allow you to sleep well and to keep your pain under control.   In six to eight hours, please begin taking ibuprofen  800 mg 3 times daily.  Please keep in mind that it is always easier to treat a little bit of pain that is to treat a lot of pain.  I recommend that for the next several days, you take this medication on a scheduled basis.  After that, take it when you begin to feel the pain returning, do not wait until you are in a lot of pain.   During the day, please set aside time to apply ice to the affected area 4 times daily for 20 minutes each application.  This can be achieved by using a bag of frozen peas or corn, a Ziploc bag filled with ice and water, or Ziploc bag filled with half rubbing alcohol and half Dawn dish detergent, frozen into a slush.  Please be careful not to apply ice directly to your skin, always place a soft cloth between you and the ice pack.  Over-the-counter products such as IcyHot and Biofreeze do not work nearly as well.   Please avoid attempts to stretch or strengthen the affected area until you are feeling completely pain-free.  Attempts to do so will only prolong the healing process.   I also recommend that you remain out of work for the next several days, I provided you with a  note to return to work in 3 days.  If you feel that you need this time extended, please follow-up with your primary care provider or return to urgent care for reevaluation so that we can provide you with a note for another 3 days.   Thank you for visiting [facility] today.  We appreciate the opportunity to participate in your care.
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
## 2024-06-11 ENCOUNTER — Telehealth: Payer: Self-pay

## 2024-06-11 NOTE — Telephone Encounter (Signed)
 Pt did not answer therefore their was a voicemail left instructing them to give the clinic a call back.
